# Patient Record
Sex: Male | Born: 1970 | Race: Black or African American | Hispanic: No | Smoking: Former smoker
Health system: Southern US, Community
[De-identification: ages and names within clinical notes are randomized; demographics above are authoritative.]

## PROBLEM LIST (undated history)

## (undated) DIAGNOSIS — F329 Major depressive disorder, single episode, unspecified: Secondary | ICD-10-CM

## (undated) DIAGNOSIS — I10 Essential (primary) hypertension: Secondary | ICD-10-CM

## (undated) DIAGNOSIS — F32A Depression, unspecified: Secondary | ICD-10-CM

## (undated) HISTORY — DX: Major depressive disorder, single episode, unspecified: F32.9

## (undated) HISTORY — DX: Essential (primary) hypertension: I10

## (undated) HISTORY — DX: Depression, unspecified: F32.A

---

## 2006-05-10 ENCOUNTER — Emergency Department: Payer: Self-pay | Admitting: Emergency Medicine

## 2009-01-30 ENCOUNTER — Emergency Department: Payer: Self-pay | Admitting: Emergency Medicine

## 2010-01-16 ENCOUNTER — Emergency Department: Payer: Self-pay | Admitting: Emergency Medicine

## 2011-07-24 ENCOUNTER — Emergency Department: Payer: Self-pay | Admitting: Emergency Medicine

## 2011-07-31 ENCOUNTER — Ambulatory Visit: Payer: Self-pay | Admitting: Internal Medicine

## 2011-11-19 HISTORY — PX: UPPER GI ENDOSCOPY: SHX6162

## 2012-03-15 IMAGING — CR DG CHEST 2V
1 series · 2 of 2 positions shown · non-contrast
Comparison: none

REASON FOR EXAM: SOB; pt in [place]
COMMENTS:

PROCEDURE:     DXR - DXR CHEST PA (OR AP) AND LATERAL  - July 25, 2011 [DATE]
RESULT:     Comparison: None

[Series 1: view not recorded · 0.17mm/px · 2 of 2 slices shown]
[im 1/2]
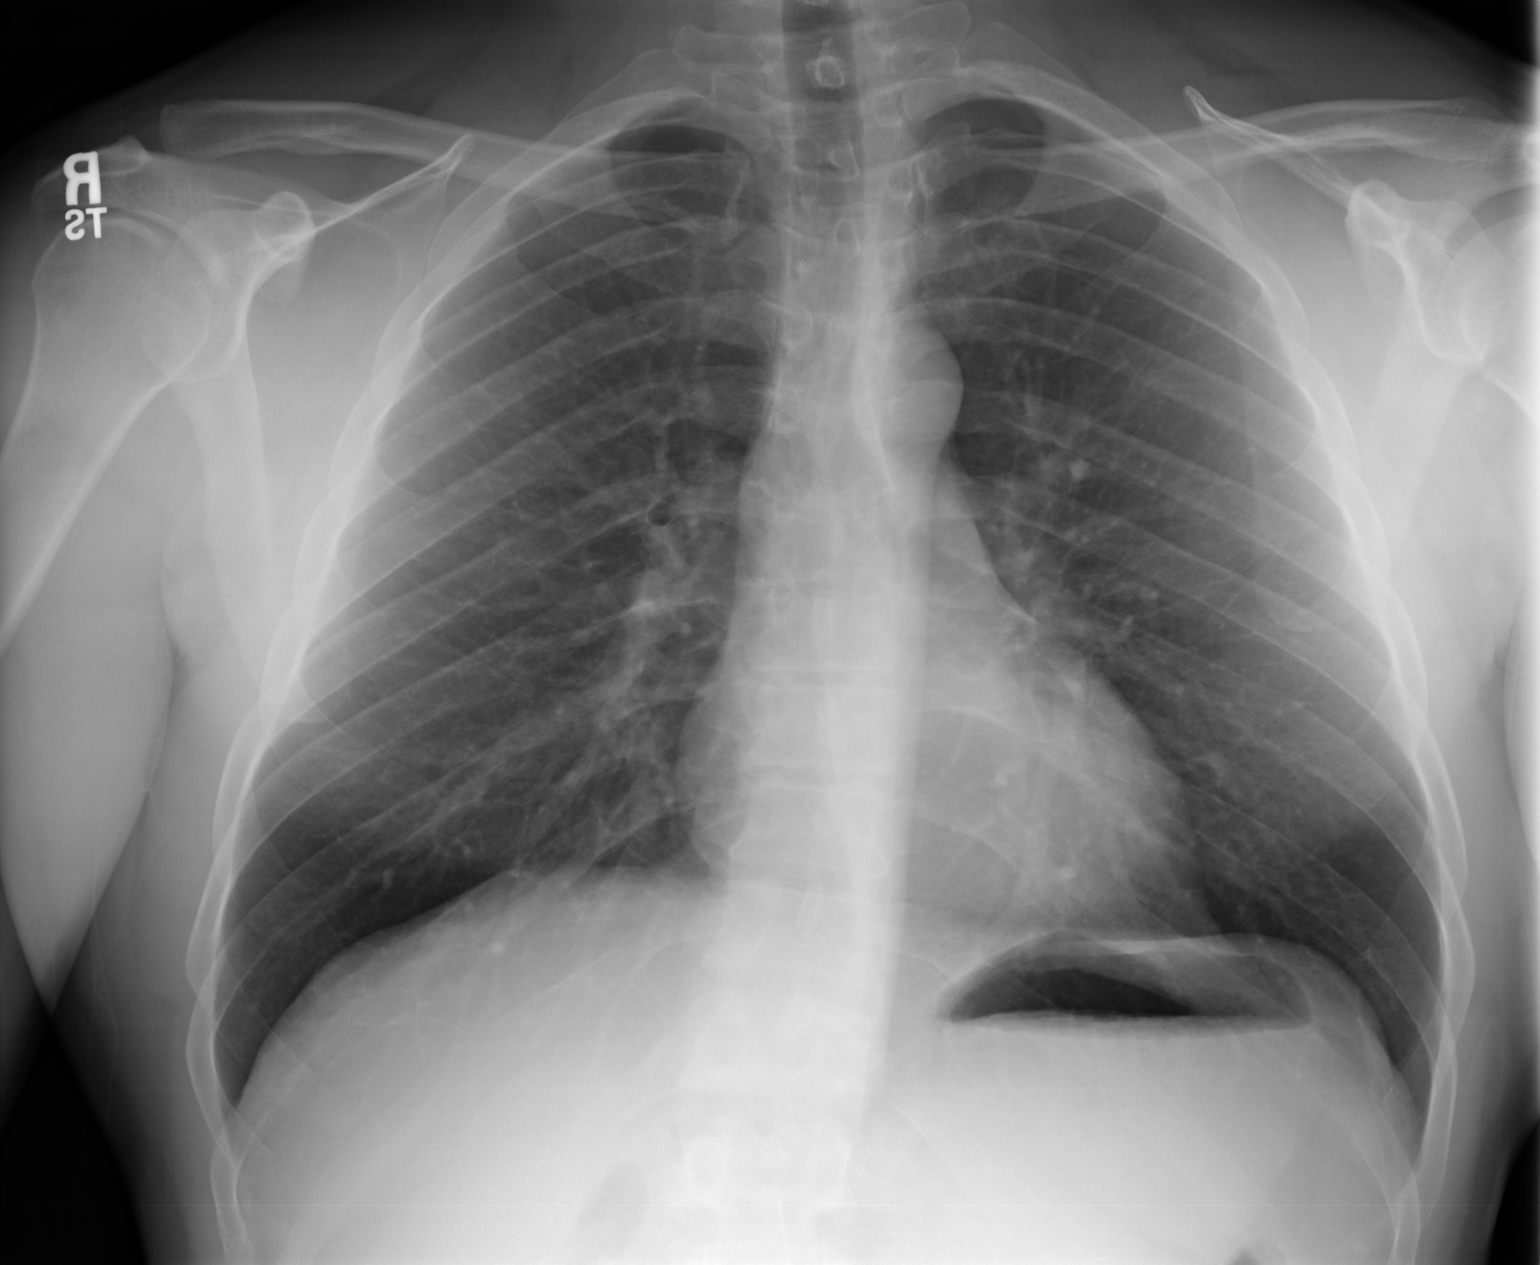
[im 2/2]
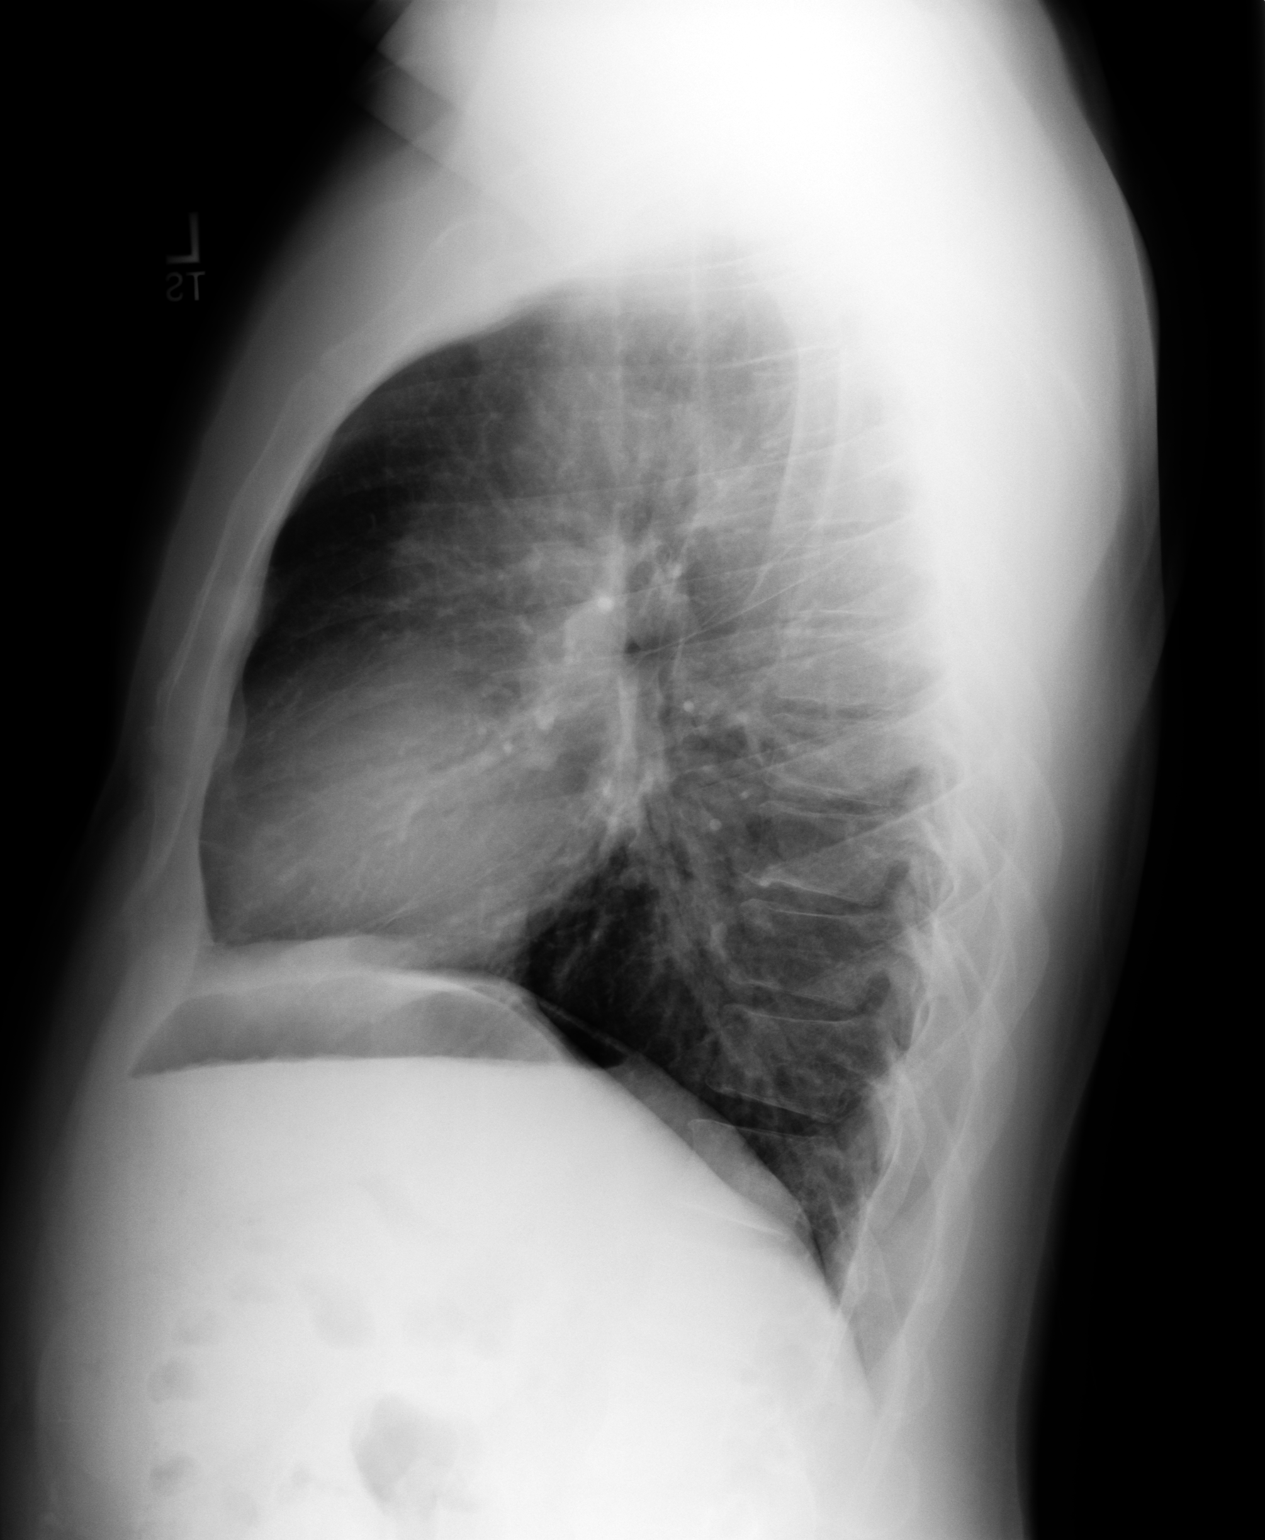

[2 of 2 positions shown; findings below may reference images not displayed]

FINDINGS: PA and lateral chest radiographs are provided.  There is no focal
parenchymal opacity, pleural effusion, or pneumothorax. The heart and
mediastinum are unremarkable.  The osseous structures are unremarkable.
IMPRESSION: No acute disease of the chest.

## 2012-04-08 ENCOUNTER — Ambulatory Visit: Payer: Self-pay | Admitting: Gastroenterology

## 2015-07-21 ENCOUNTER — Other Ambulatory Visit: Payer: Self-pay | Admitting: Family Medicine

## 2015-07-31 ENCOUNTER — Encounter: Payer: Self-pay | Admitting: Family Medicine

## 2015-07-31 ENCOUNTER — Ambulatory Visit (INDEPENDENT_AMBULATORY_CARE_PROVIDER_SITE_OTHER): Payer: No Typology Code available for payment source | Admitting: Family Medicine

## 2015-07-31 VITALS — BP 122/78 | HR 65 | Temp 98.6°F | Resp 16 | Ht 67.0 in | Wt 175.5 lb

## 2015-07-31 DIAGNOSIS — Z23 Encounter for immunization: Secondary | ICD-10-CM

## 2015-07-31 DIAGNOSIS — I10 Essential (primary) hypertension: Secondary | ICD-10-CM | POA: Diagnosis not present

## 2015-07-31 DIAGNOSIS — I517 Cardiomegaly: Secondary | ICD-10-CM

## 2015-07-31 NOTE — Patient Instructions (Addendum)
Need referral to cardiologist abnormal EKG and echocardiogram

## 2015-07-31 NOTE — Progress Notes (Signed)
Name: Bryan Green   MRN: [id number]    DOB: 1971/08/13   Date:07/31/2015       Progress Note  Subjective  Chief Complaint  Chief Complaint  Patient presents with  . Hypertension    pt here for 4 month follow up    HPI  Hypertension   Patient presents for follow-up of hypertension. It has been present for over 2 years.  Patient states that there is compliance with medical regimen which consists of chlorthalidone 25 mg daily Percocet take it consistently . There is no end organ disease. Cardiac risk factors include hypertension hyperlipidemia and diabetes.  Exercise regimen consist of aerobic as well as weight training .  Diet consist of low-sodium low-fat .  Hyperlipidemia  Patient has a history of hyperlipidemia for 3 years.  Current medical regimen consist of diet and exercise .  Compliance is good .  Diet and exercise are currently followed noted above .  Risk factors for cardiovascular disease include hyperlipidemia hypertension .   There have been no side effects from the medication.    Reflux esophagitis  Patient's reflux symptoms are not improved. He is currently on Protonix 40 mg most days and has had better hygiene with his diet.  Past Medical History  Diagnosis Date  . Hypertension   . Depression     Social History  Substance Use Topics  . Smoking status: Former Research scientist (life sciences)  . Smokeless tobacco: Not on file  . Alcohol Use: No     Current outpatient prescriptions:  .  chlordiazePOXIDE (LIBRIUM) 25 MG capsule, Take 25 mg by mouth 3 (three) times daily as needed for anxiety., Disp: , Rfl:  .  pantoprazole (PROTONIX) 40 MG tablet, TAKE 1 TABLET EVERY DAY, Disp: 30 tablet, Rfl: 5  No Known Allergies  Review of Systems  Constitutional: Negative for fever, chills and weight loss.  HENT: Negative for congestion, hearing loss, sore throat and tinnitus.   Eyes: Negative for blurred vision, double vision and redness.  Respiratory: Negative for cough, hemoptysis and  shortness of breath.   Cardiovascular: Negative for chest pain, palpitations, orthopnea, claudication and leg swelling.  Gastrointestinal: Positive for heartburn. Negative for nausea, vomiting, diarrhea, constipation and blood in stool.  Genitourinary: Negative for dysuria, urgency, frequency and hematuria.  Musculoskeletal: Negative for myalgias, back pain, joint pain, falls and neck pain.  Skin: Negative for itching.  Neurological: Negative for dizziness, tingling, tremors, focal weakness, seizures, loss of consciousness, weakness and headaches.  Endo/Heme/Allergies: Does not bruise/bleed easily.  Psychiatric/Behavioral: Negative for depression and substance abuse. The patient is not nervous/anxious and does not have insomnia.      Objective  Filed Vitals:   07/31/15 0939  BP: 122/78  Pulse: 65  Temp: 98.6 F (37 C)  Resp: 16  Height: 5\' 7"  (1.702 m)  Weight: 175 lb 8 oz (79.606 kg)  SpO2: 97%     Physical Exam  Constitutional: He is oriented to person, place, and time and well-developed, well-nourished, and in no distress.  HENT:  Head: Normocephalic.  Eyes: EOM are normal. Pupils are equal, round, and reactive to light.  Neck: Normal range of motion. Neck supple. No thyromegaly present.  Cardiovascular: Normal rate, regular rhythm and normal heart sounds.   No murmur heard. Pulmonary/Chest: Effort normal and breath sounds normal. No respiratory distress. He has no wheezes.  Abdominal: Soft. Bowel sounds are normal.  Musculoskeletal: Normal range of motion. He exhibits no edema.  Lymphadenopathy:    He has no  cervical adenopathy.  Neurological: He is alert and oriented to person, place, and time. No cranial nerve deficit. Gait normal. Coordination normal.  Skin: Skin is warm and dry. No rash noted.  Psychiatric: Affect and judgment normal.     1. Essential hypertension Well-controlled  2. LVH (left ventricular hypertrophy) The further evaluation of her  cardiologist's  Differential diagnosis include  hypertrophy from long-standing hypertension versus cardiomyopathy this has not been worked up. - EKG 12-Lead left ventricular - Echocardiogram; Future  3. Need for influenza vaccination Given today - Flu Vaccine QUAD 36+ mos PF IM (Fluarix & Fluzone Quad PF)

## 2015-08-01 ENCOUNTER — Encounter: Payer: Self-pay | Admitting: Family Medicine

## 2015-08-01 DIAGNOSIS — Z23 Encounter for immunization: Secondary | ICD-10-CM | POA: Diagnosis not present

## 2016-11-08 ENCOUNTER — Emergency Department
Admission: EM | Admit: 2016-11-08 | Discharge: 2016-11-08 | Disposition: A | Discharge status: Home / Self Care | Payer: Self-pay | Attending: Emergency Medicine | Admitting: Emergency Medicine

## 2016-11-08 ENCOUNTER — Encounter: Payer: Self-pay | Admitting: Emergency Medicine

## 2016-11-08 DIAGNOSIS — R6889 Other general symptoms and signs: Secondary | ICD-10-CM

## 2016-11-08 DIAGNOSIS — B349 Viral infection, unspecified: Secondary | ICD-10-CM | POA: Insufficient documentation

## 2016-11-08 DIAGNOSIS — Z79899 Other long term (current) drug therapy: Secondary | ICD-10-CM | POA: Insufficient documentation

## 2016-11-08 DIAGNOSIS — Z87891 Personal history of nicotine dependence: Secondary | ICD-10-CM | POA: Insufficient documentation

## 2016-11-08 DIAGNOSIS — I10 Essential (primary) hypertension: Secondary | ICD-10-CM | POA: Insufficient documentation

## 2016-11-08 LAB — POCT RAPID STREP A: Streptococcus, Group A Screen (Direct): NEGATIVE

## 2016-11-08 MED ORDER — KETOROLAC TROMETHAMINE 60 MG/2ML IM SOLN
60.0000 mg | Freq: Once | INTRAMUSCULAR | Status: AC
  Administered 2016-11-08: 60 mg via INTRAMUSCULAR
  Filled 2016-11-08: qty 2

## 2016-11-08 MED ORDER — ONDANSETRON HCL 4 MG PO TABS: 4.0000 mg | ORAL_TABLET | Freq: Three times a day (TID) | ORAL | 0 refills | Status: DC | PRN

## 2016-11-08 MED ORDER — IBUPROFEN 600 MG PO TABS: 600.0000 mg | ORAL_TABLET | Freq: Three times a day (TID) | ORAL | 0 refills | Status: DC | PRN

## 2016-11-08 MED ORDER — LIDOCAINE VISCOUS 2 % MT SOLN: 5.0000 mL | Freq: Four times a day (QID) | OROMUCOSAL | 0 refills | Status: DC | PRN

## 2016-11-08 MED ORDER — PSEUDOEPH-BROMPHEN-DM 30-2-10 MG/5ML PO SYRP: 5.0000 mL | ORAL_SOLUTION | Freq: Four times a day (QID) | ORAL | 0 refills | Status: DC | PRN

## 2016-11-08 NOTE — ED Notes (Signed)
Patient c/o sore throat for the past few days, patient also c/o left ear pain, nasal congestion, and runny nose. Patient states that he has also had nausea and diarrhea, and weakness.  Patient thinks he had flu shot but is not completely sure.

## 2016-11-08 NOTE — ED Triage Notes (Signed)
Patient arrives to ED via POV with c/o sore throat x4 days and generalized weakness. A&O x4.

## 2016-11-08 NOTE — ED Provider Notes (Signed)
[facility] Emergency Department Provider Note   ____________________________________________   First MD Initiated Contact with Patient 11/08/16 1417     (approximate)  I have reviewed the triage vital signs and the nursing notes.   HISTORY  Chief Complaint Sore Throat    HPI Bryan Green is a 45 y.o. male patient complaining of sore throat and generalized malaise for 4 days. Patient stated intermittent frontal headache, nausea without vomiting, and diarrhea. Patient denies fever and chills associated this complaint. Patient stated no relief over-the-counter medications. Patient states she believes she's taken a flu shot this season was unsure. No other palliative measures for this complaint.Patient rates his pain discomfort as 7/10. Patient describes pain as "achy".   Past Medical History:  Diagnosis Date  . Depression   . Hypertension     There are no active problems to display for this patient.   History reviewed. No pertinent surgical history.  Prior to Admission medications   Medication Sig Start Date End Date Taking? Authorizing Provider  brompheniramine-pseudoephedrine-DM 30-2-10 MG/5ML syrup Take 5 mLs by mouth 4 (four) times daily as needed. Mixed with viscous 5 mL of viscous lidocaine for swish and swallow. 11/08/16   Sable Feil, PA-C  chlordiazePOXIDE (LIBRIUM) 25 MG capsule Take 25 mg by mouth 3 (three) times daily as needed for anxiety.    Historical Provider, MD  ibuprofen (ADVIL,MOTRIN) 600 MG tablet Take 1 tablet (600 mg total) by mouth every 8 (eight) hours as needed. 11/08/16   Sable Feil, PA-C  lidocaine (XYLOCAINE) 2 % solution Use as directed 5 mLs in the mouth or throat every 6 (six) hours as needed for mouth pain. Mixed with 5 mL of Bromfed-DM for swish and swallow. 11/08/16   Sable Feil, PA-C  ondansetron (ZOFRAN) 4 MG tablet Take 1 tablet (4 mg total) by mouth every 8 (eight) hours as needed for nausea or  vomiting. 11/08/16 11/08/17  Sable Feil, PA-C  pantoprazole (PROTONIX) 40 MG tablet TAKE 1 TABLET EVERY DAY 07/21/15   Ashok Norris, MD    Allergies Patient has no known allergies.  Family History  Problem Relation Age of Onset  . Diabetes Mother   . Cancer Father   . Kidney disease Father     Social History Social History  Substance Use Topics  . Smoking status: Former Research scientist (life sciences)  . Smokeless tobacco: Not on file  . Alcohol use No    Review of Systems Constitutional: No fever/chills Eyes: No visual changes. ENT: No sore throat. Cardiovascular: Denies chest pain. Respiratory: Denies shortness of breath. Gastrointestinal: No abdominal pain.  No nausea, no vomiting.  No diarrhea.  No constipation. Genitourinary: Negative for dysuria. Musculoskeletal: Negative for back pain. Skin: Negative for rash. Neurological: Negative for headaches, focal weakness or numbness. Psychiatric:Depression Endocrine:Hypertension  ____________________________________________   PHYSICAL EXAM:  VITAL SIGNS: ED Triage Vitals  Enc Vitals Group     BP 11/08/16 1359 (!) 150/82     Pulse Rate 11/08/16 1359 66     Resp 11/08/16 1359 17     Temp 11/08/16 1359 98.3 F (36.8 C)     Temp Source 11/08/16 1359 Oral     SpO2 11/08/16 1359 99 %     Weight 11/08/16 1359 180 lb (81.6 kg)     Height 11/08/16 1359 5\' 7"  (1.702 m)     Head Circumference --      Peak Flow --      Pain Score 11/08/16 1400  7     Pain Loc --      Pain Edu? --      Excl. in [place]? --     Constitutional: Alert and oriented. Well appearing and in no acute distress. Eyes: Conjunctivae are normal. PERRL. EOMI. Head: Atraumatic. Nose: Edematous nasal turbinates with clear rhinorrhea Mouth/Throat: Mucous membranes are moist.  Oropharynx erythematous. No exudate Neck: No stridor.  No cervical spine tenderness to palpation. Hematological/Lymphatic/Immunilogical: No cervical lymphadenopathy. Cardiovascular: Normal rate,  regular rhythm. Grossly normal heart sounds.  Good peripheral circulation. Elevated blood pressure Respiratory: Normal respiratory effort.  No retractions. Lungs CTAB. Gastrointestinal: Soft and nontender. No distention. No abdominal bruits. No CVA tenderness. Musculoskeletal: No lower extremity tenderness nor edema.  No joint effusions. Neurologic:  Normal speech and language. No gross focal neurologic deficits are appreciated. No gait instability. Skin:  Skin is warm, dry and intact. No rash noted. Psychiatric: Mood and affect are normal. Speech and behavior are normal.  ____________________________________________   LABS (all labs ordered are listed, but only abnormal results are displayed)  Labs Reviewed  POCT RAPID STREP A   ___Negative rapid strep test _________________________________________  EKG   ____________________________________________  RADIOLOGY   ____________________________________________   PROCEDURES  Procedure(s) performed: None  Procedures  Critical Care performed: No  ____________________________________________   INITIAL IMPRESSION / ASSESSMENT AND PLAN / ED COURSE  Pertinent labs & imaging results that were available during my care of the patient were reviewed by me and considered in my medical decision making (see chart for details).  Viral illness. Patient given discharge Instructions. Patient Given a Prescription for Bromfed-DM Viscous Lidocaine, and naproxen. Patient given a work note.  Clinical Course      ____________________________________________   FINAL CLINICAL IMPRESSION(S) / ED DIAGNOSES  Final diagnoses:  Flu-like symptoms      NEW MEDICATIONS STARTED DURING THIS VISIT:  New Prescriptions   BROMPHENIRAMINE-PSEUDOEPHEDRINE-DM 30-2-10 MG/5ML SYRUP    Take 5 mLs by mouth 4 (four) times daily as needed. Mixed with viscous 5 mL of viscous lidocaine for swish and swallow.   IBUPROFEN (ADVIL,MOTRIN) 600 MG TABLET     Take 1 tablet (600 mg total) by mouth every 8 (eight) hours as needed.   LIDOCAINE (XYLOCAINE) 2 % SOLUTION    Use as directed 5 mLs in the mouth or throat every 6 (six) hours as needed for mouth pain. Mixed with 5 mL of Bromfed-DM for swish and swallow.   ONDANSETRON (ZOFRAN) 4 MG TABLET    Take 1 tablet (4 mg total) by mouth every 8 (eight) hours as needed for nausea or vomiting.     Note:  This document was prepared using Dragon voice recognition software and may include unintentional dictation errors.    Sable Feil, PA-C 11/08/16 1438    Lisa Roca, MD 11/08/16 [phone]

## 2016-11-08 NOTE — ED Notes (Signed)
Pt discharged home after verbalizing understanding of discharge instructions; nad noted. 

## 2016-11-10 ENCOUNTER — Encounter: Payer: Self-pay | Admitting: Emergency Medicine

## 2016-11-10 ENCOUNTER — Emergency Department
Admission: EM | Admit: 2016-11-10 | Discharge: 2016-11-10 | Disposition: A | Discharge status: Home / Self Care | Payer: No Typology Code available for payment source | Attending: Emergency Medicine | Admitting: Emergency Medicine

## 2016-11-10 DIAGNOSIS — I10 Essential (primary) hypertension: Secondary | ICD-10-CM | POA: Insufficient documentation

## 2016-11-10 DIAGNOSIS — Z791 Long term (current) use of non-steroidal anti-inflammatories (NSAID): Secondary | ICD-10-CM | POA: Insufficient documentation

## 2016-11-10 DIAGNOSIS — J029 Acute pharyngitis, unspecified: Secondary | ICD-10-CM | POA: Insufficient documentation

## 2016-11-10 DIAGNOSIS — Z87891 Personal history of nicotine dependence: Secondary | ICD-10-CM | POA: Insufficient documentation

## 2016-11-10 DIAGNOSIS — Z79899 Other long term (current) drug therapy: Secondary | ICD-10-CM | POA: Insufficient documentation

## 2016-11-10 MED ORDER — DEXAMETHASONE 4 MG PO TABS: ORAL_TABLET | ORAL | 0 refills | Status: DC

## 2016-11-10 MED ORDER — AMOXICILLIN 875 MG PO TABS: 875.0000 mg | ORAL_TABLET | Freq: Two times a day (BID) | ORAL | 0 refills | Status: DC

## 2016-11-10 NOTE — ED Provider Notes (Signed)
[facility] Emergency Department Provider Note  ____________________________________________  Time seen: Approximately 10:41 AM  I have reviewed the triage vital signs and the nursing notes.   HISTORY  Chief Complaint Sore Throat  Sore throat  HPI Bryan Green is a 45 y.o. male , NAD, presents to the emergency department with a several-day history of sore throat. Was seen in this emergency department 2 days ago for the same. Had negative rapid strep test and was prescribed medications for supportive care. States he has been taking his medications but notes the sore throat is worsening and feels more swollen. Also notes onset of left ear pressure. Denies runny nose, nasal congestion, sinus pressure, ear drainage. Has been able to eat, drink and swallow without difficulty. Has had no difficulty breathing. Denies any fevers, chills, body aches, headaches, abdominal pain, nausea, vomiting, rashes. Has not had swelling about the lips/tongue/throat. Denies any other sick contacts. Last dose of Tylenol and Naprosyn was last night.   Past Medical History:  Diagnosis Date  . Depression   . Hypertension     There are no active problems to display for this patient.   History reviewed. No pertinent surgical history.  Prior to Admission medications   Medication Sig Start Date End Date Taking? Authorizing Provider  amoxicillin (AMOXIL) 875 MG tablet Take 1 tablet (875 mg total) by mouth 2 (two) times daily. 11/10/16   Deshayla Empson L Imojean Yoshino, PA-C  brompheniramine-pseudoephedrine-DM 30-2-10 MG/5ML syrup Take 5 mLs by mouth 4 (four) times daily as needed. Mixed with viscous 5 mL of viscous lidocaine for swish and swallow. 11/08/16   Sable Feil, PA-C  chlordiazePOXIDE (LIBRIUM) 25 MG capsule Take 25 mg by mouth 3 (three) times daily as needed for anxiety.    Historical Provider, MD  dexamethasone (DECADRON) 4 MG tablet Take 6 tablets on Day 1 with food, then decrease by 1  tablet daily until finished (6,5,4,3,2,1) 11/10/16   Kenson Groh L Gwin Eagon, PA-C  ibuprofen (ADVIL,MOTRIN) 600 MG tablet Take 1 tablet (600 mg total) by mouth every 8 (eight) hours as needed. 11/08/16   Sable Feil, PA-C  lidocaine (XYLOCAINE) 2 % solution Use as directed 5 mLs in the mouth or throat every 6 (six) hours as needed for mouth pain. Mixed with 5 mL of Bromfed-DM for swish and swallow. 11/08/16   Sable Feil, PA-C  ondansetron (ZOFRAN) 4 MG tablet Take 1 tablet (4 mg total) by mouth every 8 (eight) hours as needed for nausea or vomiting. 11/08/16 11/08/17  Sable Feil, PA-C  pantoprazole (PROTONIX) 40 MG tablet TAKE 1 TABLET EVERY DAY 07/21/15   Ashok Norris, MD    Allergies Patient has no known allergies.  Family History  Problem Relation Age of Onset  . Diabetes Mother   . Cancer Father   . Kidney disease Father     Social History Social History  Substance Use Topics  . Smoking status: Former Research scientist (life sciences)  . Smokeless tobacco: Never Used  . Alcohol use No     Review of Systems  Constitutional: No fever/chills ENT: Positive sore throat, left ear pain. No nasal congestion, runny nose, sinus pressure. No swelling about the lips/tongue/throat. Cardiovascular: No chest pain. Respiratory: No cough, chest congestion. No shortness of breath. No wheezing.  Gastrointestinal: No abdominal pain.  No nausea, vomiting.   Musculoskeletal: Negative for general myalgias.  Skin: Negative for rash. Neurological: Negative for headaches. 10-point ROS otherwise negative.  ____________________________________________   PHYSICAL EXAM:  VITAL SIGNS:  ED Triage Vitals  Enc Vitals Group     BP      Pulse      Resp      Temp      Temp src      SpO2      Weight      Height      Head Circumference      Peak Flow      Pain Score      Pain Loc      Pain Edu?      Excl. in [place]?      Constitutional: Alert and oriented. Well appearing and in no acute distress. Eyes: Conjunctivae  are normal Without icterus, injection or discharge. Head: Atraumatic. ENT:      Ears: Bilateral TMs visualized without effusion, bulging, erythema or perforation.      Nose: No congestion/rhinnorhea. No epistaxis.      Mouth/Throat: Mucous membranes are moist. Pharynx with mild left-sided injection and swelling but no exudate. Uvula is midline. Airway is patent. Neck: No stridor. No carotid bruits. Supple with full range of motion. Hematological/Lymphatic/Immunilogical: Positive left, anterior, focal cervical lymphadenopathy with mild tenderness to palpation but is mobile. Cardiovascular: Normal rate, regular rhythm. Normal S1 and S2.  Good peripheral circulation. Respiratory: Normal respiratory effort without tachypnea or retractions. Lungs CTAB with breath sounds noted in all lung fields. No wheeze, rhonchi, rales Neurologic:  Normal speech and language. No gross focal neurologic deficits are appreciated.  Skin:  Skin is warm, dry and intact. No rash noted. Psychiatric: Mood and affect are normal. Speech and behavior are normal. Patient exhibits appropriate insight and judgement.   ____________________________________________   LABS  None ____________________________________________  EKG  None ____________________________________________  RADIOLOGY  None ____________________________________________    PROCEDURES  Procedure(s) performed: None   Procedures   Medications - No data to display   ____________________________________________   INITIAL IMPRESSION / ASSESSMENT AND PLAN / ED COURSE  Pertinent labs & imaging results that were available during my care of the patient were reviewed by me and considered in my medical decision making (see chart for details).  Clinical Course     Patient's diagnosis is consistent with Acute pharyngitis. Patient will be discharged home with prescriptions for amoxicillin and Decadron Dosepak to take stretch. Patient is to  discontinue use of Naprosyn or ibuprofen but may continue Tylenol. May also continue use of lidocaine viscous as needed for throat pain.. Patient is to follow up with Dr. Tami Ribas in ENT if symptoms persist past this treatment course. Patient is given ED precautions to return to the ED for any worsening or new symptoms.    ____________________________________________  FINAL CLINICAL IMPRESSION(S) / ED DIAGNOSES  Final diagnoses:  Pharyngitis, unspecified etiology      NEW MEDICATIONS STARTED DURING THIS VISIT:  New Prescriptions   AMOXICILLIN (AMOXIL) 875 MG TABLET    Take 1 tablet (875 mg total) by mouth 2 (two) times daily.   DEXAMETHASONE (DECADRON) 4 MG TABLET    Take 6 tablets on Day 1 with food, then decrease by 1 tablet daily until finished (6,5,4,3,2,1)         Braxton Feathers, PA-C 11/10/16 1108    Carrie Mew, MD 11/14/16 [phone]

## 2016-11-10 NOTE — ED Triage Notes (Signed)
Pt arrived to ED with c/o of sore throat. Pt seen in ED 2 days ago and dx with Strep throat. Pt states pain and swelling has increased.

## 2016-11-10 NOTE — ED Notes (Signed)
Pt verbalized understanding of discharge instructions. NAD at this time. 

## 2017-07-30 ENCOUNTER — Encounter: Payer: No Typology Code available for payment source | Admitting: Family Medicine

## 2017-08-01 ENCOUNTER — Encounter: Payer: No Typology Code available for payment source | Admitting: Family Medicine

## 2017-08-25 ENCOUNTER — Ambulatory Visit (INDEPENDENT_AMBULATORY_CARE_PROVIDER_SITE_OTHER): Payer: BLUE CROSS/BLUE SHIELD | Admitting: Family Medicine

## 2017-08-25 ENCOUNTER — Encounter: Payer: Self-pay | Admitting: Family Medicine

## 2017-08-25 DIAGNOSIS — I10 Essential (primary) hypertension: Secondary | ICD-10-CM | POA: Diagnosis not present

## 2017-08-25 MED ORDER — LISINOPRIL-HYDROCHLOROTHIAZIDE 10-12.5 MG PO TABS: 1.0000 | ORAL_TABLET | Freq: Every day | ORAL | 0 refills | Status: DC

## 2017-08-25 NOTE — Progress Notes (Signed)
Name: Bryan Green   MRN: [id number]    DOB: 1971-01-19   Date:08/25/2017       Progress Note  Subjective  Chief Complaint  Chief Complaint  Patient presents with  . Annual Exam  This patient is new to me, formerly seen by Dr. Rutherford Green  HPI  Pt. 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  Patient has been working on lowering his blood pressure, he has reduced his salt consumption, fried foods, he is physically active.  In the past, he has not taken any medication for blood pressure.    Past Medical History:  Diagnosis Date  . Depression   . Hypertension     No past surgical history on file.  Family History  Problem Relation Age of Onset  . Diabetes Mother   . Cancer Father   . Kidney disease Father     Social History   Social History  . Marital status: Single    Spouse name: N/A  . Number of children: N/A  . Years of education: N/A   Occupational History  . Not on file.   Social History Main Topics  . Smoking status: Former Research scientist (life sciences)  . Smokeless tobacco: Never Used  . Alcohol use No  . Drug use: No  . Sexual activity: Not on file   Other Topics Concern  . Not on file   Social History Narrative  . No narrative on file     Current Outpatient Prescriptions:  .  amoxicillin (AMOXIL) 875 MG tablet, Take 1 tablet (875 mg total) by mouth 2 (two) times daily. (Patient not taking: Reported on 08/25/2017), Disp: 20 tablet, Rfl: 0 .  brompheniramine-pseudoephedrine-DM 30-2-10 MG/5ML syrup, Take 5 mLs by mouth 4 (four) times daily as needed. Mixed with viscous 5 mL of viscous lidocaine for swish and swallow. (Patient not taking: Reported on 08/25/2017), Disp: 120 mL, Rfl: 0 .  chlordiazePOXIDE (LIBRIUM) 25 MG capsule, Take 25 mg by mouth 3 (three) times daily as  needed for anxiety., Disp: , Rfl:  .  dexamethasone (DECADRON) 4 MG tablet, Take 6 tablets on Day 1 with food, then decrease by 1 tablet daily until finished (6,5,4,3,2,1) (Patient not taking: Reported on 08/25/2017), Disp: 21 tablet, Rfl: 0 .  ibuprofen (ADVIL,MOTRIN) 600 MG tablet, Take 1 tablet (600 mg total) by mouth every 8 (eight) hours as needed. (Patient not taking: Reported on 08/25/2017), Disp: 15 tablet, Rfl: 0 .  lidocaine (XYLOCAINE) 2 % solution, Use as directed 5 mLs in the mouth or throat every 6 (six) hours as needed for mouth pain. Mixed with 5 mL of Bromfed-DM for swish and swallow. (Patient not taking: Reported on 08/25/2017), Disp: 100 mL, Rfl: 0 .  ondansetron (ZOFRAN) 4 MG tablet, Take 1 tablet (4 mg total) by mouth every 8 (eight) hours as needed for nausea or vomiting. (Patient not taking: Reported on 08/25/2017), Disp: 12 tablet, Rfl: 0 .  pantoprazole (PROTONIX) 40 MG tablet, TAKE 1 TABLET EVERY DAY (Patient not taking: Reported on 08/25/2017), Disp: 30 tablet, Rfl: 5  No Known Allergies   ROS  Please see history of present illness for complete discussion of ROS  Objective  Vitals:   08/25/17 1310  BP: (!) 158/84  Pulse: 64  Resp: 16  Temp: 98.3  F (36.8 C)  TempSrc: Oral  SpO2: 99%  Weight: 180 lb 12.8 oz (82 kg)  Height: 5\' 7"  (1.702 m)    Physical Exam  Constitutional: He is oriented to person, place, and time and well-developed, well-nourished, and in no distress.  HENT:  Head: Normocephalic and atraumatic.  Eyes: Pupils are equal, round, and reactive to light.  Cardiovascular: Normal rate, regular rhythm and normal heart sounds.   No murmur heard. Pulmonary/Chest: Effort normal and breath sounds normal. He has no wheezes.  Abdominal: Soft. Bowel sounds are normal. There is no tenderness.  Musculoskeletal: He exhibits no edema.  Neurological: He is alert and oriented to person, place, and time.  Psychiatric: Mood, memory, affect and judgment normal.   Nursing note and vitals reviewed.      Assessment & Plan  1. Essential hypertension Elevated blood pressure and worse on manual repeat, start on lisinopril-HCTZ, check liver, kidney and intellect), reassess in 4 weeks - lisinopril-hydrochlorothiazide (PRINZIDE,ZESTORETIC) 10-12.5 MG tablet; Take 1 tablet by mouth daily.  Dispense: 90 tablet; Refill: 0 - COMPLETE METABOLIC PANEL WITH GFR   Bryan Green [facility] 08/25/2017 1:36 PM

## 2017-08-26 ENCOUNTER — Telehealth: Payer: Self-pay | Admitting: Family Medicine

## 2017-08-26 DIAGNOSIS — I10 Essential (primary) hypertension: Secondary | ICD-10-CM

## 2017-08-26 NOTE — Telephone Encounter (Signed)
Pt was seen on yesterday and was prescribed a new blood pressure medication (lisinopril0. 2 hours after taking the medication this morning the patient has been a little dizzy and gittery and has been having a headache all day. Pt think it maybe to strong. He is asking that you please prescribe the medication that he was on before (he does not remember the name of the medication. Please send to [facility]. Please give pt a call once this has been taken care of

## 2017-08-26 NOTE — Telephone Encounter (Signed)
Please call patient

## 2017-08-27 MED ORDER — CHLORTHALIDONE 25 MG PO TABS: 25.0000 mg | ORAL_TABLET | Freq: Every day | ORAL | 0 refills | Status: DC

## 2017-08-27 NOTE — Telephone Encounter (Signed)
Patient was prescribed lisinopril/HCTZ 10-12.5 mg for elevated blood pressure. He reports 82 took it he felt dizzy and had a headache all day yesterday. 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 He reports that he was prescribed a blood pressure medicine by Dr. Derrek Monaco which seems to work and had no side effects. Upon review of notes and Dr. Mechele Collin, it appears that he was on no blood pressure medicine but Dr. Salem Senate history of present illness from 07/31/2015 and noted that he was on chlorthalidone. Will give a trial of chlorthalidone for patient's blood pressure, advised to return tomorrow to  obtain lab work, continue checking blood pressure regularly and follow-up in 6 weeks

## 2017-08-28 ENCOUNTER — Ambulatory Visit: Payer: BLUE CROSS/BLUE SHIELD | Admitting: Emergency Medicine

## 2017-08-28 VITALS — BP 138/96

## 2017-08-28 DIAGNOSIS — I10 Essential (primary) hypertension: Secondary | ICD-10-CM

## 2017-08-28 LAB — COMPLETE METABOLIC PANEL WITH GFR
AG RATIO: 1.7 (calc) (ref 1.0–2.5)
ALT: 18 U/L (ref 9–46)
AST: 18 U/L (ref 10–40)
Albumin: 4.5 g/dL (ref 3.6–5.1)
Alkaline phosphatase (APISO): 64 U/L (ref 40–115)
BILIRUBIN TOTAL: 0.3 mg/dL (ref 0.2–1.2)
BUN: 12 mg/dL (ref 7–25)
CHLORIDE: 101 mmol/L (ref 98–110)
CO2: 29 mmol/L (ref 20–32)
Calcium: 10 mg/dL (ref 8.6–10.3)
Creat: 1.1 mg/dL (ref 0.60–1.35)
GFR, EST AFRICAN AMERICAN: 92 mL/min/{1.73_m2} (ref >=60)
GFR, EST NON AFRICAN AMERICAN: 80 mL/min/{1.73_m2} (ref >=60)
GLOBULIN: 2.6 g/dL (ref 1.9–3.7)
Glucose, Bld: 88 mg/dL (ref 65–139)
POTASSIUM: 4.5 mmol/L (ref 3.5–5.3)
SODIUM: 140 mmol/L (ref 135–146)
TOTAL PROTEIN: 7.1 g/dL (ref 6.1–8.1)

## 2017-08-29 ENCOUNTER — Telehealth: Payer: Self-pay

## 2017-08-29 NOTE — Telephone Encounter (Signed)
Patient came for B/P check.

## 2017-08-29 NOTE — Telephone Encounter (Signed)
-----   Message from Roselee Nova, MD sent at 08/29/2017  9:51 AM EDT ----- CMP shows normal glucose, electrolytes, kidney function and liver enzymes

## 2017-08-29 NOTE — Telephone Encounter (Signed)
Called pt, no answer. LM for pt informing him of information below.

## 2017-09-22 ENCOUNTER — Encounter: Payer: Self-pay | Admitting: Family Medicine

## 2017-09-22 ENCOUNTER — Ambulatory Visit (INDEPENDENT_AMBULATORY_CARE_PROVIDER_SITE_OTHER): Payer: BLUE CROSS/BLUE SHIELD | Admitting: Family Medicine

## 2017-09-22 VITALS — BP 136/82 | HR 76 | Temp 98.2°F | Resp 16 | Ht 67.0 in | Wt 179.7 lb

## 2017-09-22 DIAGNOSIS — Z1211 Encounter for screening for malignant neoplasm of colon: Secondary | ICD-10-CM

## 2017-09-22 DIAGNOSIS — Z0000 Encounter for general adult medical examination without abnormal findings: Secondary | ICD-10-CM

## 2017-09-22 DIAGNOSIS — Z23 Encounter for immunization: Secondary | ICD-10-CM

## 2017-09-22 DIAGNOSIS — Z125 Encounter for screening for malignant neoplasm of prostate: Secondary | ICD-10-CM

## 2017-09-22 DIAGNOSIS — B029 Zoster without complications: Secondary | ICD-10-CM

## 2017-09-22 MED ORDER — VALACYCLOVIR HCL 1 G PO TABS: 1000.0000 mg | ORAL_TABLET | Freq: Three times a day (TID) | ORAL | 0 refills | Status: AC

## 2017-09-22 NOTE — Progress Notes (Signed)
Name: Bryan Green   MRN: [id number]    DOB: 1971/03/03   Date:09/22/2017       Progress Note  Subjective  Chief Complaint  Chief Complaint  Patient presents with  . Annual Exam    HPI  Pt. Presents for Annual Physical Exam. No family history of colon cancer or prostate cancer but father did pass away from 'some form of cancer' similarly grandmother also passed away from 'cancer'.     Past Medical History:  Diagnosis Date  . Depression    pt. does not recall ever being diagnosed with depression, neither being on medications.   . Hypertension     Past Surgical History:  Procedure Laterality Date  . UPPER GI ENDOSCOPY Bilateral 2013    Family History  Problem Relation Age of Onset  . Diabetes Mother   . Cancer Father   . Kidney disease Father   . Hypertension Father     Social History   Socioeconomic History  . Marital status: Single    Spouse name: Not on file  . Number of children: Not on file  . Years of education: Not on file  . Highest education level: Not on file  Social Needs  . Financial resource strain: Not on file  . Food insecurity - worry: Not on file  . Food insecurity - inability: Not on file  . Transportation needs - medical: Not on file  . Transportation needs - non-medical: Not on file  Occupational History  . Not on file  Tobacco Use  . Smoking status: Former Research scientist (life sciences)  . Smokeless tobacco: Never Used  Substance and Sexual Activity  . Alcohol use: No    Alcohol/week: 0.0 oz  . Drug use: No  . Sexual activity: Yes  Other Topics Concern  . Not on file  Social History Narrative  . Not on file     Current Outpatient Medications:  .  chlorthalidone (HYGROTON) 25 MG tablet, Take 1 tablet (25 mg total) by mouth daily., Disp: 90 tablet, Rfl: 0  Allergies  Allergen Reactions  . Lisinopril     Dizziness, headache and drowsy      Review of Systems  Constitutional: Negative for chills, fever and malaise/fatigue.  HENT: Negative for  congestion, ear discharge, ear pain and sore throat.   Eyes: Negative for blurred vision and double vision.  Respiratory: Negative for cough, sputum production and shortness of breath.   Cardiovascular: Negative for chest pain, palpitations (feels palpitations on and off, sometimes associated with acid relfux) and leg swelling.  Gastrointestinal: Negative for abdominal pain, blood in stool, constipation, diarrhea, heartburn, nausea and vomiting.  Genitourinary: Negative for dysuria, hematuria and urgency.  Musculoskeletal: Negative for back pain and neck pain.  Skin: Negative for itching and rash (itchy rash on his left back, ? insect bite).  Neurological: Negative for dizziness and headaches.  Psychiatric/Behavioral: Negative for depression. The patient is not nervous/anxious and does not have insomnia.       Objective  Vitals:   09/22/17 1551  BP: 136/82  Pulse: 76  Resp: 16  Temp: 98.2 F (36.8 C)  TempSrc: Oral  SpO2: 98%  Weight: 179 lb 11.2 oz (81.5 kg)  Height: 5\' 7"  (1.702 m)    Physical Exam  Constitutional: He is oriented to person, place, and time and well-developed, well-nourished, and in no distress.  HENT:  Head: Normocephalic and atraumatic.  Right Ear: External ear normal.  Left Ear: External ear normal.  Cardiovascular: Normal rate, regular  rhythm and normal heart sounds.  No murmur heard. Pulmonary/Chest: Effort normal and breath sounds normal. He has no wheezes.  Abdominal: Soft. Bowel sounds are normal. There is no tenderness.  Genitourinary: Rectum normal and prostate normal. Prostate is not enlarged and not tender.  Musculoskeletal: He exhibits no edema.  Neurological: He is alert and oriented to person, place, and time.  Skin: Rash noted. Rash is macular and pustular.  papulo-pustular rash on the left lateral chest wall, fluid visible within the blisters.    Psychiatric: Mood, memory, affect and judgment normal.  Nursing note and vitals  reviewed.     Recent Results (from the past 2160 hour(s))  COMPLETE METABOLIC PANEL WITH GFR     Status: None   Collection Time: 08/28/17  3:11 PM  Result Value Ref Range   Glucose, Bld 88 65 - 139 mg/dL    Comment: .        Non-fasting reference interval .    BUN 12 7 - 25 mg/dL   Creat 1.10 0.60 - 1.35 mg/dL   GFR, Est Non African American 80 > OR = 60 mL/min/1.37m2   GFR, Est African American 92 > OR = 60 mL/min/1.54m2   BUN/Creatinine Ratio NOT APPLICABLE 6 - 22 (calc)   Sodium 140 135 - 146 mmol/L   Potassium 4.5 3.5 - 5.3 mmol/L   Chloride 101 98 - 110 mmol/L   CO2 29 20 - 32 mmol/L   Calcium 10.0 8.6 - 10.3 mg/dL   Total Protein 7.1 6.1 - 8.1 g/dL   Albumin 4.5 3.6 - 5.1 g/dL   Globulin 2.6 1.9 - 3.7 g/dL (calc)   AG Ratio 1.7 1.0 - 2.5 (calc)   Total Bilirubin 0.3 0.2 - 1.2 mg/dL   Alkaline phosphatase (APISO) 64 40 - 115 U/L   AST 18 10 - 40 U/L   ALT 18 9 - 46 U/L     Assessment & Plan  1. Needs flu shot  - Flu Vaccine QUAD 6+ mos PF IM (Fluarix Quad PF)  2. Annual physical exam Obtain age-appropriate laboratory screening - CBC with Differential/Platelet - COMPLETE METABOLIC PANEL WITH GFR - Lipid panel - TSH - VITAMIN D 25 Hydroxy (Vit-D Deficiency, Fractures)  3. Screen for colon cancer Patient's father passed away from 'some form of cancer', records not available. We will order: Guard for screening - Cologuard  4. Screening for prostate cancer  - PSA  5. Herpes zoster without complication Rash is consistent with herpes zoster outbreak, start on Valtrex, explained to take OTC analgesics for pain relief, if pain persists and her rash gets worse, he should contact us right away. - valACYclovir (VALTREX) 1000 MG tablet; Take 1 tablet (1,000 mg total) 3 (three) times daily for 7 days by mouth.  Dispense: 21 tablet; Refill: 0  Ryen Heitmeyer [facility] 09/22/2017 4:19 PM

## 2017-09-23 ENCOUNTER — Encounter: Payer: Self-pay | Admitting: Family Medicine

## 2017-09-26 ENCOUNTER — Encounter: Payer: BLUE CROSS/BLUE SHIELD | Admitting: Family Medicine

## 2017-10-06 ENCOUNTER — Telehealth: Payer: Self-pay | Admitting: Family Medicine

## 2017-10-06 NOTE — Telephone Encounter (Signed)
Copied from [place] [id number]. Topic: Quick Communication - See Telephone Encounter >> Oct 06, 2017  1:35 PM Bryan Green, NT wrote: CRM for notification. See Telephone encounter for: Pt. Is calling in about the colon test that he was supposed to have insurance want cover it. Pt. Michela Pitcher that they said he had to be 46 years of age to take that kind of test.Pt was wanted to see if the doctor can change the type of colon test that is covered by his insurance. Pt. Would like a call back.  10/06/17.

## 2017-10-20 NOTE — Telephone Encounter (Signed)
Ptt  Made  With   [place]

## 2017-10-20 NOTE — Telephone Encounter (Signed)
Pt  Reports     His   Blood   Pressure  Is   Elevated   His  Is in [place]    And  Is  Getting a  Dot  Physical   He  Has  No  Symptoms  He  Is  Requesting  An appointment  To See  Dr Manuella Ghazi  About  Is  Physical    For  Possible  bp  management

## 2017-10-22 ENCOUNTER — Ambulatory Visit: Payer: Self-pay | Admitting: Family Medicine

## 2017-10-22 ENCOUNTER — Encounter: Payer: Self-pay | Admitting: Family Medicine

## 2017-10-22 ENCOUNTER — Ambulatory Visit (INDEPENDENT_AMBULATORY_CARE_PROVIDER_SITE_OTHER): Payer: BLUE CROSS/BLUE SHIELD | Admitting: Family Medicine

## 2017-10-22 VITALS — BP 142/88 | HR 84 | Temp 98.2°F | Resp 16 | Ht 67.0 in | Wt 176.7 lb

## 2017-10-22 DIAGNOSIS — I10 Essential (primary) hypertension: Secondary | ICD-10-CM

## 2017-10-22 MED ORDER — LOSARTAN POTASSIUM 25 MG PO TABS: 12.5000 mg | ORAL_TABLET | Freq: Every day | ORAL | 0 refills | Status: DC

## 2017-10-22 NOTE — Progress Notes (Signed)
Name: Bryan Green   MRN: [id number]    DOB: September 12, 1971   Date:10/22/2017       Progress Note  Subjective  Chief Complaint  Chief Complaint  Patient presents with  . Hypertension    patient presents today due to his BP being elevated on DOT physical    HPI  Patient presents with concern for elevated BP reading.  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  He has not taken his Chlorthalidone yet today and he is slightly elevated at 142/88, has had ongoing elevated BP readings in our office over the last few months as well.  Denies headaches, dizziness, lightheadedness, chest pain, shortness of breath, or swelling.  Patient Active Problem List   Diagnosis Date Noted  . Hypertension 08/25/2017    Social History   Tobacco Use  . Smoking status: Former Research scientist (life sciences)  . Smokeless tobacco: Never Used  . Tobacco comment: patient stated that he quit 2010  Substance Use Topics  . Alcohol use: No    Alcohol/week: 0.0 oz     Current Outpatient Medications:  .  chlorthalidone (HYGROTON) 25 MG tablet, Take 1 tablet (25 mg total) by mouth daily., Disp: 90 tablet, Rfl: 0  Allergies  Allergen Reactions  . Lisinopril     Dizziness, headache and drowsy     ROS  Constitutional: Negative for fever or weight change.  Respiratory: Negative for cough and shortness of breath.   Cardiovascular: Negative for chest pain or palpitations.  Gastrointestinal: Negative for abdominal pain, no bowel changes.  Musculoskeletal: Negative for gait problem or joint swelling.  Skin: Negative for rash.  Neurological: Negative for dizziness or headache.  No other specific complaints in a complete review of systems (except as listed in HPI above).  Objective  Vitals:   10/22/17 1337  BP: (!) 142/88  Pulse: 84  Resp: 16  Temp: 98.2 F (36.8 C)  TempSrc: Oral  SpO2: 98%  Weight:  176 lb 11.2 oz (80.2 kg)  Height: 5\' 7"  (1.702 m)   Body mass index is 27.68 kg/m.  Nursing Note and Vital Signs reviewed.  Physical Exam  Constitutional: Patient appears well-developed and well-nourished.  No distress.  HEENT: head atraumatic, normocephalic, pupils equal and reactive to light, EOM's intact Cardiovascular: Normal rate, regular rhythm, S1/S2 present.  No murmur or rub heard. No BLE edema. Pulmonary/Chest: Effort normal and breath sounds clear. No respiratory distress or retractions. Psychiatric: Patient has a normal mood and affect. behavior is normal. Judgment and thought content normal.  Recent Results (from the past 2160 hour(s))  COMPLETE METABOLIC PANEL WITH GFR     Status: None   Collection Time: 08/28/17  3:11 PM  Result Value Ref Range   Glucose, Bld 88 65 - 139 mg/dL    Comment: .        Non-fasting reference interval .    BUN 12 7 - 25 mg/dL   Creat 1.10 0.60 - 1.35 mg/dL   GFR, Est Non African American 80 > OR = 60 mL/min/1.70m2   GFR, Est African American 92 > OR = 60 mL/min/1.92m2   BUN/Creatinine Ratio NOT APPLICABLE 6 - 22 (calc)   Sodium 140 135 - 146 mmol/L   Potassium 4.5 3.5 - 5.3 mmol/L   Chloride 101 98 - 110 mmol/L   CO2 29 20 - 32 mmol/L  Calcium 10.0 8.6 - 10.3 mg/dL   Total Protein 7.1 6.1 - 8.1 g/dL   Albumin 4.5 3.6 - 5.1 g/dL   Globulin 2.6 1.9 - 3.7 g/dL (calc)   AG Ratio 1.7 1.0 - 2.5 (calc)   Total Bilirubin 0.3 0.2 - 1.2 mg/dL   Alkaline phosphatase (APISO) 64 40 - 115 U/L   AST 18 10 - 40 U/L   ALT 18 9 - 46 U/L     Assessment & Plan  1. Essential hypertension - losartan (COZAAR) 25 MG tablet; Take 0.5 tablets (12.5 mg total) by mouth daily. 1/2 tablet until BP follow-up.  Dispense: 30 tablet; Refill: 0 - DASH Diet discussed in detail. - Continue Chlorthalidone; patient has concerns about starting new medication at too high of a dose because he developed headaches and dizziness on Lisinopril, so he would like to  start at a low dose of losartan - advised 12.5mg  x3 days, return for BP, will increase to 25mg  if still not controlled or at upper limits of normal.  -Red flags and when to present for emergency care or RTC including fever >101.6F, chest pain, shortness of breath, new/worsening/un-resolving symptoms, reviewed with patient at time of visit. Follow up and care instructions discussed and provided in AVS.

## 2017-10-22 NOTE — Patient Instructions (Addendum)
DASH Eating Plan DASH stands for "Dietary Approaches to Stop Hypertension." The DASH eating plan is a healthy eating plan that has been shown to reduce high blood pressure (hypertension). It may also reduce your risk for type 2 diabetes, heart disease, and stroke. The DASH eating plan may also help with weight loss. What are tips for following this plan? General guidelines  Avoid eating more than 2,300 mg (milligrams) of salt (sodium) a day. If you have hypertension, you may need to reduce your sodium intake to 1,500 mg a day.  Limit alcohol intake to no more than 1 drink a day for nonpregnant women and 2 drinks a day for men. One drink equals 12 oz of beer, 5 oz of wine, or 1 oz of hard liquor.  Work with your health care provider to maintain a healthy body weight or to lose weight. Ask what an ideal weight is for you.  Get at least 30 minutes of exercise that causes your heart to beat faster (aerobic exercise) most days of the week. Activities may include walking, swimming, or biking.  Work with your health care provider or diet and nutrition specialist (dietitian) to adjust your eating plan to your individual calorie needs. Reading food labels  Check food labels for the amount of sodium per serving. Choose foods with less than 5 percent of the Daily Value of sodium. Generally, foods with less than 300 mg of sodium per serving fit into this eating plan.  To find whole grains, look for the word "whole" as the first word in the ingredient list. Shopping  Buy products labeled as "low-sodium" or "no salt added."  Buy fresh foods. Avoid canned foods and premade or frozen meals. Cooking  Avoid adding salt when cooking. Use salt-free seasonings or herbs instead of table salt or sea salt. Check with your health care provider or pharmacist before using salt substitutes.  Do not fry foods. Cook foods using healthy methods such as baking, boiling, grilling, and broiling instead.  Cook with  heart-healthy oils, such as olive, canola, soybean, or sunflower oil. Meal planning   Eat a balanced diet that includes: ? 5 or more servings of fruits and vegetables each day. At each meal, try to fill half of your plate with fruits and vegetables. ? Up to 6-8 servings of whole grains each day. ? Less than 6 oz of lean meat, poultry, or fish each day. A 3-oz serving of meat is about the same size as a deck of cards. One egg equals 1 oz. ? 2 servings of low-fat dairy each day. ? A serving of nuts, seeds, or beans 5 times each week. ? Heart-healthy fats. Healthy fats called Omega-3 fatty acids are found in foods such as flaxseeds and coldwater fish, like sardines, salmon, and mackerel.  Limit how much you eat of the following: ? Canned or prepackaged foods. ? Food that is high in trans fat, such as fried foods. ? Food that is high in saturated fat, such as fatty meat. ? Sweets, desserts, sugary drinks, and other foods with added sugar. ? Full-fat dairy products.  Do not salt foods before eating.  Try to eat at least 2 vegetarian meals each week.  Eat more home-cooked food and less restaurant, buffet, and fast food.  When eating at a restaurant, ask that your food be prepared with less salt or no salt, if possible. What foods are recommended? The items listed may not be a complete list. Talk with your dietitian about what   dietary choices are best for you. Grains Whole-grain or whole-wheat bread. Whole-grain or whole-wheat pasta. Brown rice. Oatmeal. Quinoa. Bulgur. Whole-grain and low-sodium cereals. Pita bread. Low-fat, low-sodium crackers. Whole-wheat flour tortillas. Vegetables Fresh or frozen vegetables (raw, steamed, roasted, or grilled). Low-sodium or reduced-sodium tomato and vegetable juice. Low-sodium or reduced-sodium tomato sauce and tomato paste. Low-sodium or reduced-sodium canned vegetables. Fruits All fresh, dried, or frozen fruit. Canned fruit in natural juice (without  added sugar). Meat and other protein foods Skinless chicken or turkey. Ground chicken or turkey. Pork with fat trimmed off. Fish and seafood. Egg whites. Dried beans, peas, or lentils. Unsalted nuts, nut butters, and seeds. Unsalted canned beans. Lean cuts of beef with fat trimmed off. Low-sodium, lean deli meat. Dairy Low-fat (1%) or fat-free (skim) milk. Fat-free, low-fat, or reduced-fat cheeses. Nonfat, low-sodium ricotta or cottage cheese. Low-fat or nonfat yogurt. Low-fat, low-sodium cheese. Fats and oils Soft margarine without trans fats. Vegetable oil. Low-fat, reduced-fat, or light mayonnaise and salad dressings (reduced-sodium). Canola, safflower, olive, soybean, and sunflower oils. Avocado. Seasoning and other foods Herbs. Spices. Seasoning mixes without salt. Unsalted popcorn and pretzels. Fat-free sweets. What foods are not recommended? The items listed may not be a complete list. Talk with your dietitian about what dietary choices are best for you. Grains Baked goods made with fat, such as croissants, muffins, or some breads. Dry pasta or rice meal packs. Vegetables Creamed or fried vegetables. Vegetables in a cheese sauce. Regular canned vegetables (not low-sodium or reduced-sodium). Regular canned tomato sauce and paste (not low-sodium or reduced-sodium). Regular tomato and vegetable juice (not low-sodium or reduced-sodium). Pickles. Olives. Fruits Canned fruit in a light or heavy syrup. Fried fruit. Fruit in cream or butter sauce. Meat and other protein foods Fatty cuts of meat. Ribs. Fried meat. Bacon. Sausage. Bologna and other processed lunch meats. Salami. Fatback. Hotdogs. Bratwurst. Salted nuts and seeds. Canned beans with added salt. Canned or smoked fish. Whole eggs or egg yolks. Chicken or turkey with skin. Dairy Whole or 2% milk, cream, and half-and-half. Whole or full-fat cream cheese. Whole-fat or sweetened yogurt. Full-fat cheese. Nondairy creamers. Whipped toppings.  Processed cheese and cheese spreads. Fats and oils Butter. Stick margarine. Lard. Shortening. Ghee. Bacon fat. Tropical oils, such as coconut, palm kernel, or palm oil. Seasoning and other foods Salted popcorn and pretzels. Onion salt, garlic salt, seasoned salt, table salt, and sea salt. Worcestershire sauce. Tartar sauce. Barbecue sauce. Teriyaki sauce. Soy sauce, including reduced-sodium. Steak sauce. Canned and packaged gravies. Fish sauce. Oyster sauce. Cocktail sauce. Horseradish that you find on the shelf. Ketchup. Mustard. Meat flavorings and tenderizers. Bouillon cubes. Hot sauce and Tabasco sauce. Premade or packaged marinades. Premade or packaged taco seasonings. Relishes. Regular salad dressings. Where to find more information:  National Heart, Lung, and Blood Institute: www.nhlbi.nih.gov  American Heart Association: www.heart.org Summary  The DASH eating plan is a healthy eating plan that has been shown to reduce high blood pressure (hypertension). It may also reduce your risk for type 2 diabetes, heart disease, and stroke.  With the DASH eating plan, you should limit salt (sodium) intake to 2,300 mg a day. If you have hypertension, you may need to reduce your sodium intake to 1,500 mg a day.  When on the DASH eating plan, aim to eat more fresh fruits and vegetables, whole grains, lean proteins, low-fat dairy, and heart-healthy fats.  Work with your health care provider or diet and nutrition specialist (dietitian) to adjust your eating plan to your individual   calorie needs. This information is not intended to replace advice given to you by your health care provider. Make sure you discuss any questions you have with your health care provider. Document Released: 10/24/2011 Document Revised: 10/28/2016 Document Reviewed: 10/28/2016 Elsevier Interactive Patient Education  2017 Elsevier Inc.  

## 2017-10-24 ENCOUNTER — Ambulatory Visit: Payer: BLUE CROSS/BLUE SHIELD

## 2017-10-24 VITALS — BP 118/78 | HR 70

## 2017-10-24 DIAGNOSIS — I10 Essential (primary) hypertension: Secondary | ICD-10-CM

## 2017-10-24 NOTE — Progress Notes (Signed)
Pt here today for BP check per Raquel Sarna. Pt has no complaints today, denies chest pain, chest tightness,headache,dizziness.

## 2017-11-15 ENCOUNTER — Other Ambulatory Visit: Payer: Self-pay | Admitting: Family Medicine

## 2017-11-15 DIAGNOSIS — I10 Essential (primary) hypertension: Secondary | ICD-10-CM

## 2017-12-12 ENCOUNTER — Other Ambulatory Visit: Payer: Self-pay | Admitting: Family Medicine

## 2017-12-12 DIAGNOSIS — I10 Essential (primary) hypertension: Secondary | ICD-10-CM

## 2018-01-15 ENCOUNTER — Other Ambulatory Visit: Payer: Self-pay

## 2018-01-15 DIAGNOSIS — I10 Essential (primary) hypertension: Secondary | ICD-10-CM

## 2018-01-16 MED ORDER — LOSARTAN POTASSIUM 25 MG PO TABS: 12.5000 mg | ORAL_TABLET | Freq: Every day | ORAL | 0 refills | Status: DC

## 2018-01-16 NOTE — Telephone Encounter (Signed)
Pt notified will be in next week.

## 2018-01-16 NOTE — Telephone Encounter (Signed)
Patient needs Cr and K+; part of multiple outstanding labs that are overdue Please ask patient to have labs done I'll supply limited Rx

## 2018-02-26 ENCOUNTER — Other Ambulatory Visit: Payer: Self-pay

## 2018-02-26 DIAGNOSIS — I10 Essential (primary) hypertension: Secondary | ICD-10-CM

## 2018-02-26 MED ORDER — CHLORTHALIDONE 25 MG PO TABS: 25.0000 mg | ORAL_TABLET | Freq: Every day | ORAL | 0 refills | Status: DC

## 2018-02-26 NOTE — Telephone Encounter (Signed)
Hypertension medication request: Chlorthalidone to [facility].  Last office visit pertaining to hypertension:  10/22/2017   BP Readings from Last 3 Encounters:  10/24/17 118/78  10/22/17 (!) 142/88  09/22/17 136/82    Lab Results  Component Value Date   CREATININE 1.10 08/28/2017   BUN 12 08/28/2017   NA 140 08/28/2017   K 4.5 08/28/2017   CL 101 08/28/2017   CO2 29 08/28/2017     No follow-ups on file.   Patient will need a follow up for more refills.

## 2018-02-26 NOTE — Telephone Encounter (Signed)
LVM for pt to call and schedule an appt °

## 2018-02-26 NOTE — Telephone Encounter (Signed)
Please make appointment for further refills

## 2018-04-09 ENCOUNTER — Other Ambulatory Visit: Payer: Self-pay | Admitting: Family Medicine

## 2018-04-09 ENCOUNTER — Other Ambulatory Visit: Payer: Self-pay | Admitting: Nurse Practitioner

## 2018-04-09 DIAGNOSIS — I10 Essential (primary) hypertension: Secondary | ICD-10-CM

## 2018-04-10 NOTE — Telephone Encounter (Signed)
Please call  ____  Patient needs appointment within the next 2 weeks for refills will give supply up until appointment when created let me know

## 2018-04-20 NOTE — Telephone Encounter (Signed)
Left message for patient to make appointment.

## 2018-04-24 ENCOUNTER — Telehealth: Payer: Self-pay | Admitting: Family Medicine

## 2018-04-24 NOTE — Telephone Encounter (Signed)
LVM for patient to call our office to schedule and appointment for medication refill.  Our office received a refill request for Chlorthalid 25mg , but patient hasn't been seen since 10/2017.

## 2018-04-27 ENCOUNTER — Encounter: Payer: Self-pay | Admitting: Nurse Practitioner

## 2018-04-27 ENCOUNTER — Ambulatory Visit (INDEPENDENT_AMBULATORY_CARE_PROVIDER_SITE_OTHER): Payer: BLUE CROSS/BLUE SHIELD | Admitting: Nurse Practitioner

## 2018-04-27 VITALS — BP 124/80 | HR 64 | Temp 98.4°F | Resp 18 | Ht 67.0 in | Wt 185.0 lb

## 2018-04-27 DIAGNOSIS — E663 Overweight: Secondary | ICD-10-CM

## 2018-04-27 DIAGNOSIS — Z0000 Encounter for general adult medical examination without abnormal findings: Secondary | ICD-10-CM | POA: Diagnosis not present

## 2018-04-27 DIAGNOSIS — Z125 Encounter for screening for malignant neoplasm of prostate: Secondary | ICD-10-CM

## 2018-04-27 DIAGNOSIS — Z114 Encounter for screening for human immunodeficiency virus [HIV]: Secondary | ICD-10-CM | POA: Diagnosis not present

## 2018-04-27 DIAGNOSIS — K219 Gastro-esophageal reflux disease without esophagitis: Secondary | ICD-10-CM | POA: Diagnosis not present

## 2018-04-27 DIAGNOSIS — Z79899 Other long term (current) drug therapy: Secondary | ICD-10-CM | POA: Diagnosis not present

## 2018-04-27 DIAGNOSIS — I10 Essential (primary) hypertension: Secondary | ICD-10-CM | POA: Diagnosis not present

## 2018-04-27 MED ORDER — RANITIDINE HCL 150 MG PO CAPS: 150.0000 mg | ORAL_CAPSULE | Freq: Two times a day (BID) | ORAL | 3 refills | Status: DC

## 2018-04-27 MED ORDER — LOSARTAN POTASSIUM 25 MG PO TABS: 12.5000 mg | ORAL_TABLET | Freq: Every day | ORAL | 0 refills | Status: DC

## 2018-04-27 NOTE — Progress Notes (Addendum)
Name: Bryan Green   MRN: [id number]    DOB: 04/09/1971   Date:04/27/2018       Progress Note  Subjective  Chief Complaint  Chief Complaint  Patient presents with  . Hypertension    follow up, medication refill  . Gastroesophageal Reflux    medication refill    HPI  Hypertension Patient is on chlorthalidone 25mg  and losartan 25mg  daily.  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  Is relatively compliant with low-salt diet- not adding any salt, eating foods lower in salt.  Does not check blood pressures at home. States in general has been more active and has been eating better this last month. Goal is to stay consistent with working out. Patient has been doing 30 minutes 2-3 times a week of cardio, which dumbbells every other night and stretching every day.  Denies chest pain, headaches, blurry vision.  Wt Readings from Last 3 Encounters:  04/27/18 185 lb (83.9 kg)  10/22/17 176 lb 11.2 oz (80.2 kg)  09/22/17 179 lb 11.2 oz (81.5 kg)   GERD Patient endorses heartburn after eating a big meal. States has been trying to regulate it and generally is controlled by diet but has some cheat days. Patient has not been taking medications for a few years but would like to have some available if he needs it.    Patient Active Problem List   Diagnosis Date Noted  . GERD (gastroesophageal reflux disease) 04/27/2018  . Hypertension 08/25/2017    Past Medical History:  Diagnosis Date  . Depression    pt. does not recall ever being diagnosed with depression, neither being on medications.   . Hypertension     Past Surgical History:  Procedure Laterality Date  . UPPER GI ENDOSCOPY Bilateral 2013    Social History   Tobacco Use  . Smoking status: Former Research scientist (life sciences)  . Smokeless tobacco: Never Used  . Tobacco comment: patient stated that he quit 2010  Substance Use Topics  . Alcohol use: No    Alcohol/week: 0.0 oz     Current Outpatient  Medications:  .  chlorthalidone (HYGROTON) 25 MG tablet, Take 1 tablet (25 mg total) by mouth daily., Disp: 30 tablet, Rfl: 0 .  losartan (COZAAR) 25 MG tablet, Take 0.5 tablets (12.5 mg total) by mouth daily. 1/2 tablet until BP follow-up., Disp: 15 tablet, Rfl: 0  Allergies  Allergen Reactions  . Lisinopril     Dizziness, headache and drowsy     ROS   No other specific complaints in a complete review of systems (except as listed in HPI above).  Objective  Vitals:   04/27/18 0818  BP: 124/80  Pulse: 64  Resp: 18  Temp: 98.4 F (36.9 C)  TempSrc: Oral  SpO2: 96%  Weight: 185 lb (83.9 kg)  Height: 5\' 7"  (1.702 m)    Body mass index is 28.98 kg/m.  Nursing Note and Vital Signs reviewed.  Physical Exam  Constitutional: Patient appears well-developed and well-nourished. No distress.  Cardiovascular: Normal rate, regular rhythm, S1/S2 present.  No murmur or rub heard.  Pulmonary/Chest: Effort normal and breath sounds clear. No respiratory distress or retractions. Psychiatric: Patient has a normal mood and affect. behavior is normal. Judgment and thought content normal.  No results found for this or any previous visit (from the past 72 hour(s)).  Assessment & Plan  1. Essential hypertension - DASH, continue dieting and exercise - Basic Metabolic  Panel (BMET) - losartan (COZAAR) 25 MG tablet; Take 0.5 tablets (12.5 mg total) by mouth daily. 1/2 tablet until BP follow-up.  Dispense: 15 tablet; Refill: 0  2. Medication management  - Basic Metabolic Panel (BMET)  3. Gastroesophageal reflux disease without esophagitis -discussed diet, small frequent meals, avoiding triggers, TUMS PRN and then if needed can take ranitidine - ranitidine (ZANTAC) 150 MG capsule; Take 1 capsule (150 mg total) by mouth 2 (two) times daily.  Dispense: 60 capsule; Refill: 3  4. Overweight (BMI 25.0-29.9) Cont improving diet and exercise  5. Screening for HIV (human immunodeficiency  virus)  - HIV antibody (with reflex)  Face-to-face time with patient was more than 25 minutes, >50% time spent counseling and coordination of care -Red flags and when to present for emergency care or RTC including fever >101.72F, chest pain, shortness of breath,  reviewed with patient at time of visit. Follow up and care instructions discussed and provided in AVS. -Reviewed Health Maintenance: HIV updated, cologuard resent   -------------------------------------------- I have reviewed this encounter including the documentation in this note and/or discussed this patient with the provider, Suezanne Cheshire DNP AGNP-C. I am certifying that I agree with the content of this note as supervising physician. Enid Derry, [facility] 05/01/2018, 5:49 PM

## 2018-04-27 NOTE — Patient Instructions (Addendum)
Hypertension: - Can check BP at drug store 2 times  a week or buy a blood pressure cuff (can get one at [facility]) and be aware of your blood pressure - Restarting losartan - Follow up in one month   Acid Reflux: - Eat smaller meals, more frequently  - Avoiding acidic foods and if you know you're going to eat them take a tums 30 minutes before  - If that doesn't help can take zantac   DASH Eating Plan DASH stands for "Dietary Approaches to Stop Hypertension." The DASH eating plan is a healthy eating plan that has been shown to reduce high blood pressure (hypertension). It may also reduce your risk for type 2 diabetes, heart disease, and stroke. The DASH eating plan may also help with weight loss. What are tips for following this plan? General guidelines  Avoid eating more than 2,300 mg (milligrams) of salt (sodium) a day. If you have hypertension, you may need to reduce your sodium intake to 1,500 mg a day.  Limit alcohol intake to no more than 1 drink a day for nonpregnant women and 2 drinks a day for men. One drink equals 12 oz of beer, 5 oz of wine, or 1 oz of hard liquor.  Work with your health care provider to maintain a healthy body weight or to lose weight. Ask what an ideal weight is for you.  Get at least 30 minutes of exercise that causes your heart to beat faster (aerobic exercise) most days of the week. Activities may include walking, swimming, or biking.  Work with your health care provider or diet and nutrition specialist (dietitian) to adjust your eating plan to your individual calorie needs. Reading food labels  Check food labels for the amount of sodium per serving. Choose foods with less than 5 percent of the Daily Value of sodium. Generally, foods with less than 300 mg of sodium per serving fit into this eating plan.  To find whole grains, look for the word "whole" as the first word in the ingredient list. Shopping  Buy products labeled as "low-sodium" or "no  salt added."  Buy fresh foods. Avoid canned foods and premade or frozen meals. Cooking  Avoid adding salt when cooking. Use salt-free seasonings or herbs instead of table salt or sea salt. Check with your health care provider or pharmacist before using salt substitutes.  Do not fry foods. Cook foods using healthy methods such as baking, boiling, grilling, and broiling instead.  Cook with heart-healthy oils, such as olive, canola, soybean, or sunflower oil. Meal planning   Eat a balanced diet that includes: ? 5 or more servings of fruits and vegetables each day. At each meal, try to fill half of your plate with fruits and vegetables. ? Up to 6-8 servings of whole grains each day. ? Less than 6 oz of lean meat, poultry, or fish each day. A 3-oz serving of meat is about the same size as a deck of cards. One egg equals 1 oz. ? 2 servings of low-fat dairy each day. ? A serving of nuts, seeds, or beans 5 times each week. ? Heart-healthy fats. Healthy fats called Omega-3 fatty acids are found in foods such as flaxseeds and coldwater fish, like sardines, salmon, and mackerel.  Limit how much you eat of the following: ? Canned or prepackaged foods. ? Food that is high in trans fat, such as fried foods. ? Food that is high in saturated fat, such as fatty meat. ? Sweets,  desserts, sugary drinks, and other foods with added sugar. ? Full-fat dairy products.  Do not salt foods before eating.  Try to eat at least 2 vegetarian meals each week.  Eat more home-cooked food and less restaurant, buffet, and fast food.  When eating at a restaurant, ask that your food be prepared with less salt or no salt, if possible. What foods are recommended? The items listed may not be a complete list. Talk with your dietitian about what dietary choices are best for you. Grains Whole-grain or whole-wheat bread. Whole-grain or whole-wheat pasta. Brown rice. [place]. Bulgur. Whole-grain and low-sodium  cereals. Pita bread. Low-fat, low-sodium crackers. Whole-wheat flour tortillas. Vegetables Fresh or frozen vegetables (raw, steamed, roasted, or grilled). Low-sodium or reduced-sodium tomato and vegetable juice. Low-sodium or reduced-sodium tomato sauce and tomato paste. Low-sodium or reduced-sodium canned vegetables. Fruits All fresh, dried, or frozen fruit. Canned fruit in natural juice (without added sugar). Meat and other protein foods Skinless chicken or [place]. Ground chicken or [place]. Pork with fat trimmed off. Fish and seafood. Egg whites. Dried beans, peas, or lentils. Unsalted nuts, nut butters, and seeds. Unsalted canned beans. Lean cuts of beef with fat trimmed off. Low-sodium, lean deli meat. Dairy Low-fat (1%) or fat-free (skim) milk. Fat-free, low-fat, or reduced-fat cheeses. Nonfat, low-sodium ricotta or cottage cheese. Low-fat or nonfat yogurt. Low-fat, low-sodium cheese. Fats and oils Soft margarine without trans fats. Vegetable oil. Low-fat, reduced-fat, or light mayonnaise and salad dressings (reduced-sodium). Canola, safflower, olive, soybean, and sunflower oils. Avocado. Seasoning and other foods Herbs. Spices. Seasoning mixes without salt. Unsalted popcorn and pretzels. Fat-free sweets. What foods are not recommended? The items listed may not be a complete list. Talk with your dietitian about what dietary choices are best for you. Grains Baked goods made with fat, such as croissants, muffins, or some breads. Dry pasta or rice meal packs. Vegetables Creamed or fried vegetables. Vegetables in a cheese sauce. Regular canned vegetables (not low-sodium or reduced-sodium). Regular canned tomato sauce and paste (not low-sodium or reduced-sodium). Regular tomato and vegetable juice (not low-sodium or reduced-sodium). Angie Fava. Olives. Fruits Canned fruit in a light or heavy syrup. Fried fruit. Fruit in cream or butter sauce. Meat and other protein foods Fatty cuts of meat. Ribs.  Fried meat. Berniece Salines. Sausage. Bologna and other processed lunch meats. Salami. Fatback. Hotdogs. Bratwurst. Salted nuts and seeds. Canned beans with added salt. Canned or smoked fish. Whole eggs or egg yolks. Chicken or [place] with skin. Dairy Whole or 2% milk, cream, and half-and-half. Whole or full-fat cream cheese. Whole-fat or sweetened yogurt. Full-fat cheese. Nondairy creamers. Whipped toppings. Processed cheese and cheese spreads. Fats and oils Butter. Stick margarine. Lard. Shortening. Ghee. Bacon fat. Tropical oils, such as coconut, palm kernel, or palm oil. Seasoning and other foods Salted popcorn and pretzels. Onion salt, garlic salt, seasoned salt, table salt, and sea salt. Worcestershire sauce. Tartar sauce. Barbecue sauce. Teriyaki sauce. Soy sauce, including reduced-sodium. Steak sauce. Canned and packaged gravies. Fish sauce. Oyster sauce. Cocktail sauce. Horseradish that you find on the shelf. Ketchup. Mustard. Meat flavorings and tenderizers. Bouillon cubes. Hot sauce and Tabasco sauce. Premade or packaged marinades. Premade or packaged taco seasonings. Relishes. Regular salad dressings. Where to find more information:  National Heart, Lung, and [place]: https://wilson-eaton.com/  American Heart Association: www.heart.org Summary  The DASH eating plan is a healthy eating plan that has been shown to reduce high blood pressure (hypertension). It may also reduce your risk for type 2 diabetes, heart  disease, and stroke.  With the DASH eating plan, you should limit salt (sodium) intake to 2,300 mg a day. If you have hypertension, you may need to reduce your sodium intake to 1,500 mg a day.  When on the DASH eating plan, aim to eat more fresh fruits and vegetables, whole grains, lean proteins, low-fat dairy, and heart-healthy fats.  Work with your health care provider or diet and nutrition specialist (dietitian) to adjust your eating plan to your individual calorie needs. This  information is not intended to replace advice given to you by your health care provider. Make sure you discuss any questions you have with your health care provider. Document Released: 10/24/2011 Document Revised: 10/28/2016 Document Reviewed: 10/28/2016 Elsevier Interactive Patient Education  2018 [place] for Gastroesophageal Reflux Disease, Adult When you have gastroesophageal reflux disease (GERD), the foods you eat and your eating habits are very important. Choosing the right foods can help ease your discomfort. What guidelines do I need to follow?  Choose fruits, vegetables, whole grains, and low-fat dairy products.  Choose low-fat meat, fish, and poultry.  Limit fats such as oils, salad dressings, butter, nuts, and avocado.  Keep a food diary. This helps you identify foods that cause symptoms.  Avoid foods that cause symptoms. These may be different for everyone.  Eat small meals often instead of 3 large meals a day.  Eat your meals slowly, in a place where you are relaxed.  Limit fried foods.  Cook foods using methods other than frying.  Avoid drinking alcohol.  Avoid drinking large amounts of liquids with your meals.  Avoid bending over or lying down until 2-3 hours after eating. What foods are not recommended? These are some foods and drinks that may make your symptoms worse: Vegetables Tomatoes. Tomato juice. Tomato and spaghetti sauce. Chili peppers. Onion and garlic. Horseradish. Fruits Oranges, grapefruit, and lemon (fruit and juice). Meats High-fat meats, fish, and poultry. This includes hot dogs, ribs, ham, sausage, salami, and bacon. Dairy Whole milk and chocolate milk. Sour cream. Cream. Butter. Ice cream. Cream cheese. Drinks Coffee and tea. Bubbly (carbonated) drinks or energy drinks. Condiments Hot sauce. Barbecue sauce. Sweets/Desserts Chocolate and cocoa. Donuts. Peppermint and spearmint. Fats and Oils High-fat foods. This  includes [place] fries and potato chips. Other Vinegar. Strong spices. This includes black pepper, white pepper, red pepper, cayenne, curry powder, cloves, ginger, and chili powder. The items listed above may not be a complete list of foods and drinks to avoid. Contact your dietitian for more information. This information is not intended to replace advice given to you by your health care provider. Make sure you discuss any questions you have with your health care provider. Document Released: 05/05/2012 Document Revised: 04/11/2016 Document Reviewed: 09/08/2013 Elsevier Interactive Patient Education  2017 [employer].

## 2018-04-28 ENCOUNTER — Other Ambulatory Visit: Payer: Self-pay | Admitting: Nurse Practitioner

## 2018-04-28 DIAGNOSIS — I10 Essential (primary) hypertension: Secondary | ICD-10-CM

## 2018-04-28 LAB — CBC WITH DIFFERENTIAL/PLATELET
BASOS PCT: 1.4 %
Basophils Absolute: 62 cells/uL (ref 0–200)
EOS PCT: 4.6 %
Eosinophils Absolute: 202 cells/uL (ref 15–500)
HEMATOCRIT: 40.3 % (ref 38.5–50.0)
HEMOGLOBIN: 13.7 g/dL (ref 13.2–17.1)
LYMPHS ABS: 1179 {cells}/uL (ref 850–3900)
MCH: 27.5 pg (ref 27.0–33.0)
MCHC: 34 g/dL (ref 32.0–36.0)
MCV: 80.8 fL (ref 80.0–100.0)
MPV: 11.3 fL (ref 7.5–12.5)
Monocytes Relative: 9.6 %
NEUTROS ABS: 2534 {cells}/uL (ref 1500–7800)
Neutrophils Relative %: 57.6 %
Platelets: 234 10*3/uL (ref 140–400)
RBC: 4.99 10*6/uL (ref 4.20–5.80)
RDW: 12.9 % (ref 11.0–15.0)
Total Lymphocyte: 26.8 %
WBC mixed population: 422 cells/uL (ref 200–950)
WBC: 4.4 10*3/uL (ref 3.8–10.8)

## 2018-04-28 LAB — COMPLETE METABOLIC PANEL WITH GFR
AG Ratio: 1.8 (calc) (ref 1.0–2.5)
ALBUMIN MSPROF: 4.4 g/dL (ref 3.6–5.1)
ALKALINE PHOSPHATASE (APISO): 61 U/L (ref 40–115)
ALT: 30 U/L (ref 9–46)
AST: 34 U/L (ref 10–40)
BUN: 14 mg/dL (ref 7–25)
CO2: 30 mmol/L (ref 20–32)
CREATININE: 1.24 mg/dL (ref 0.60–1.35)
Calcium: 9.7 mg/dL (ref 8.6–10.3)
Chloride: 105 mmol/L (ref 98–110)
GFR, EST AFRICAN AMERICAN: 79 mL/min/{1.73_m2} (ref >=60)
GFR, Est Non African American: 68 mL/min/{1.73_m2} (ref >=60)
GLUCOSE: 86 mg/dL (ref 65–99)
Globulin: 2.4 g/dL (calc) (ref 1.9–3.7)
Potassium: 4.4 mmol/L (ref 3.5–5.3)
Sodium: 142 mmol/L (ref 135–146)
TOTAL PROTEIN: 6.8 g/dL (ref 6.1–8.1)
Total Bilirubin: 0.5 mg/dL (ref 0.2–1.2)

## 2018-04-28 LAB — LIPID PANEL
Cholesterol: 195 mg/dL (ref <200)
HDL: 49 mg/dL (ref >40)
LDL Cholesterol (Calc): 134 mg/dL (calc) — ABNORMAL HIGH
Non-HDL Cholesterol (Calc): 146 mg/dL (calc) — ABNORMAL HIGH (ref <130)
Total CHOL/HDL Ratio: 4 (calc) (ref <5.0)
Triglycerides: 41 mg/dL (ref <150)

## 2018-04-28 LAB — TSH: TSH: 0.76 m[IU]/L (ref 0.40–4.50)

## 2018-04-28 LAB — VITAMIN D 25 HYDROXY (VIT D DEFICIENCY, FRACTURES): Vit D, 25-Hydroxy: 39 ng/mL (ref 30–100)

## 2018-04-28 LAB — HIV ANTIBODY (ROUTINE TESTING W REFLEX): HIV 1&2 Ab, 4th Generation: NONREACTIVE

## 2018-04-28 LAB — PSA: PSA: 1.1 ng/mL (ref <=4.0)

## 2018-05-27 ENCOUNTER — Telehealth: Payer: Self-pay | Admitting: Family Medicine

## 2018-05-27 NOTE — Telephone Encounter (Signed)
Copied from [place] [id number]. Topic: Quick Communication - See Telephone Encounter >> May 27, 2018  3:49 PM Rutherford Nail, NT wrote: CRM for notification. See Telephone encounter for: 05/27/18. Larene Beach at [employer] and states that Dr Manuella Ghazi placed a colo guard back in 09/2017 and another one was placed by Suezanne Cheshire on 04/27/18. Would like to know what order to cancel. Please advise. CB#: [phone] option 2 for provider support.   Order #: [id number] Manuella Ghazi)                [id number] Cecil Cranker)

## 2018-05-28 NOTE — Telephone Encounter (Signed)
Spoke to [place] and she took out the order under Dr. Manuella Ghazi name since he is no longer a provider here and will use the second order for [facility] NP

## 2018-05-30 ENCOUNTER — Telehealth: Payer: Self-pay | Admitting: Nurse Practitioner

## 2018-05-30 ENCOUNTER — Other Ambulatory Visit: Payer: Self-pay | Admitting: Nurse Practitioner

## 2018-05-30 DIAGNOSIS — I10 Essential (primary) hypertension: Secondary | ICD-10-CM

## 2018-05-30 NOTE — Telephone Encounter (Signed)
Can you please clarify what blood pressure medications patient is on? Just losartan or taking chlorthalidone as well and dosages.

## 2018-06-01 NOTE — Telephone Encounter (Signed)
Called patient he is taking Losartan 25 mg. It was decreased because his blood pressure was doing well.

## 2018-06-02 ENCOUNTER — Encounter: Payer: Self-pay | Admitting: Family Medicine

## 2018-06-02 ENCOUNTER — Ambulatory Visit: Payer: BLUE CROSS/BLUE SHIELD | Admitting: Family Medicine

## 2018-06-02 VITALS — BP 118/72 | HR 73 | Temp 98.4°F | Resp 16 | Ht 67.0 in | Wt 184.8 lb

## 2018-06-02 DIAGNOSIS — E7800 Pure hypercholesterolemia, unspecified: Secondary | ICD-10-CM | POA: Diagnosis not present

## 2018-06-02 DIAGNOSIS — K219 Gastro-esophageal reflux disease without esophagitis: Secondary | ICD-10-CM | POA: Diagnosis not present

## 2018-06-02 DIAGNOSIS — I10 Essential (primary) hypertension: Secondary | ICD-10-CM

## 2018-06-02 DIAGNOSIS — E785 Hyperlipidemia, unspecified: Secondary | ICD-10-CM | POA: Insufficient documentation

## 2018-06-02 MED ORDER — LOSARTAN POTASSIUM 25 MG PO TABS: 25.0000 mg | ORAL_TABLET | Freq: Every day | ORAL | 1 refills | Status: DC

## 2018-06-02 NOTE — Progress Notes (Signed)
Name: Bryan Green   MRN: [id number]    DOB: [birth year]   Date:06/02/2018       Progress Note  Subjective  Chief Complaint  Chief Complaint  Patient presents with  . Hypertension    follow up    HPI  Pt presents for HTN follow up - he stopped chlorthalidone at last visit in June 2019. He has been taking 25mg  Losartan alone and is feeling well.  Denies chest pain, shortness of breath, headaches, swelling, vision changes. Last CMP on 04/27/2018 was WNL.  BP is at goal today, he has been focusing on avoiding salt, though he still eats salted peanuts while driving sometimes.  Elevated LDL - last check was 04/27/2018; he has been focusing on his diet. LDL was 134.  He denies chest pain, shortness of breath.  We will recheck at his 6 month follow up.  GERD: Doing well with dietary changes; taking zantac only as needed.  Denies regurgitation, choking, abdominal pain, vomiting, or stool changes.  Patient Active Problem List   Diagnosis Date Noted  . GERD (gastroesophageal reflux disease) 04/27/2018  . Hypertension 08/25/2017    Social History   Tobacco Use  . Smoking status: Former Research scientist (life sciences)  . Smokeless tobacco: Never Used  . Tobacco comment: patient stated that he quit 2010  Substance Use Topics  . Alcohol use: No    Alcohol/week: 0.0 oz     Current Outpatient Medications:  .  losartan (COZAAR) 25 MG tablet, TAKE 0.5 TABLETS (12.5 MG TOTAL) BY MOUTH DAILY. 1/2 TABLET UNTIL BP FOLLOW-UP., Disp: 15 tablet, Rfl: 0 .  ranitidine (ZANTAC) 150 MG capsule, Take 1 capsule (150 mg total) by mouth 2 (two) times daily. (Patient not taking: Reported on 06/02/2018), Disp: 60 capsule, Rfl: 3  Allergies  Allergen Reactions  . Lisinopril     Dizziness, headache and drowsy     ROS  Constitutional: Negative for fever or weight change.  Respiratory: Negative for cough and shortness of breath.   Cardiovascular: Negative for chest pain or palpitations.  Gastrointestinal: Negative for abdominal  pain, no bowel changes.  Musculoskeletal: Negative for gait problem or joint swelling.  Skin: Negative for rash.  Neurological: Negative for dizziness or headache.  No other specific complaints in a complete review of systems (except as listed in HPI above).  Objective  Vitals:   06/02/18 0810  BP: 118/72  Pulse: 73  Resp: 16  Temp: 98.4 F (36.9 C)  TempSrc: Oral  SpO2: 99%  Weight: 184 lb 12.8 oz (83.8 kg)  Height: 5\' 7"  (1.702 m)   Body mass index is 28.94 kg/m.  Nursing Note and Vital Signs reviewed.  Physical Exam  Constitutional: Patient appears well-developed and well-nourished.  No distress.  HEENT: head atraumatic, normocephalic Cardiovascular: Normal rate, regular rhythm, S1/S2 present.  No murmur or rub heard. No BLE edema. Pulmonary/Chest: Effort normal and breath sounds clear. No respiratory distress or retractions. Psychiatric: Patient has a normal mood and affect. behavior is normal. Judgment and thought content normal.  No results found for this or any previous visit (from the past 72 hour(s)).  Assessment & Plan  1. Essential hypertension - DASH diet discussed - losartan (COZAAR) 25 MG tablet; Take 1 tablet (25 mg total) by mouth daily. 1/2 tablet until BP follow-up.  Dispense: 90 tablet; Refill: 1  2. Gastroesophageal reflux disease without esophagitis - Stable, Zantac PRN  3. Elevated LDL cholesterol level - Continue to work on diet and exercise. Will recheck fasting  labs in 6 months.

## 2018-06-02 NOTE — Telephone Encounter (Signed)
error 

## 2018-06-02 NOTE — Patient Instructions (Signed)
DASH Eating Plan DASH stands for "Dietary Approaches to Stop Hypertension." The DASH eating plan is a healthy eating plan that has been shown to reduce high blood pressure (hypertension). It may also reduce your risk for type 2 diabetes, heart disease, and stroke. The DASH eating plan may also help with weight loss. What are tips for following this plan? General guidelines  Avoid eating more than 2,300 mg (milligrams) of salt (sodium) a day. If you have hypertension, you may need to reduce your sodium intake to 1,500 mg a day.  Limit alcohol intake to no more than 1 drink a day for nonpregnant women and 2 drinks a day for men. One drink equals 12 oz of beer, 5 oz of wine, or 1 oz of hard liquor.  Work with your health care provider to maintain a healthy body weight or to lose weight. Ask what an ideal weight is for you.  Get at least 30 minutes of exercise that causes your heart to beat faster (aerobic exercise) most days of the week. Activities may include walking, swimming, or biking.  Work with your health care provider or diet and nutrition specialist (dietitian) to adjust your eating plan to your individual calorie needs. Reading food labels  Check food labels for the amount of sodium per serving. Choose foods with less than 5 percent of the Daily Value of sodium. Generally, foods with less than 300 mg of sodium per serving fit into this eating plan.  To find whole grains, look for the word "whole" as the first word in the ingredient list. Shopping  Buy products labeled as "low-sodium" or "no salt added."  Buy fresh foods. Avoid canned foods and premade or frozen meals. Cooking  Avoid adding salt when cooking. Use salt-free seasonings or herbs instead of table salt or sea salt. Check with your health care provider or pharmacist before using salt substitutes.  Do not fry foods. Cook foods using healthy methods such as baking, boiling, grilling, and broiling instead.  Cook with  heart-healthy oils, such as olive, canola, soybean, or sunflower oil. Meal planning   Eat a balanced diet that includes: ? 5 or more servings of fruits and vegetables each day. At each meal, try to fill half of your plate with fruits and vegetables. ? Up to 6-8 servings of whole grains each day. ? Less than 6 oz of lean meat, poultry, or fish each day. A 3-oz serving of meat is about the same size as a deck of cards. One egg equals 1 oz. ? 2 servings of low-fat dairy each day. ? A serving of nuts, seeds, or beans 5 times each week. ? Heart-healthy fats. Healthy fats called Omega-3 fatty acids are found in foods such as flaxseeds and coldwater fish, like sardines, salmon, and mackerel.  Limit how much you eat of the following: ? Canned or prepackaged foods. ? Food that is high in trans fat, such as fried foods. ? Food that is high in saturated fat, such as fatty meat. ? Sweets, desserts, sugary drinks, and other foods with added sugar. ? Full-fat dairy products.  Do not salt foods before eating.  Try to eat at least 2 vegetarian meals each week.  Eat more home-cooked food and less restaurant, buffet, and fast food.  When eating at a restaurant, ask that your food be prepared with less salt or no salt, if possible. What foods are recommended? The items listed may not be a complete list. Talk with your dietitian about what   dietary choices are best for you. Grains Whole-grain or whole-wheat bread. Whole-grain or whole-wheat pasta. Brown rice. Oatmeal. Quinoa. Bulgur. Whole-grain and low-sodium cereals. Pita bread. Low-fat, low-sodium crackers. Whole-wheat flour tortillas. Vegetables Fresh or frozen vegetables (raw, steamed, roasted, or grilled). Low-sodium or reduced-sodium tomato and vegetable juice. Low-sodium or reduced-sodium tomato sauce and tomato paste. Low-sodium or reduced-sodium canned vegetables. Fruits All fresh, dried, or frozen fruit. Canned fruit in natural juice (without  added sugar). Meat and other protein foods Skinless chicken or turkey. Ground chicken or turkey. Pork with fat trimmed off. Fish and seafood. Egg whites. Dried beans, peas, or lentils. Unsalted nuts, nut butters, and seeds. Unsalted canned beans. Lean cuts of beef with fat trimmed off. Low-sodium, lean deli meat. Dairy Low-fat (1%) or fat-free (skim) milk. Fat-free, low-fat, or reduced-fat cheeses. Nonfat, low-sodium ricotta or cottage cheese. Low-fat or nonfat yogurt. Low-fat, low-sodium cheese. Fats and oils Soft margarine without trans fats. Vegetable oil. Low-fat, reduced-fat, or light mayonnaise and salad dressings (reduced-sodium). Canola, safflower, olive, soybean, and sunflower oils. Avocado. Seasoning and other foods Herbs. Spices. Seasoning mixes without salt. Unsalted popcorn and pretzels. Fat-free sweets. What foods are not recommended? The items listed may not be a complete list. Talk with your dietitian about what dietary choices are best for you. Grains Baked goods made with fat, such as croissants, muffins, or some breads. Dry pasta or rice meal packs. Vegetables Creamed or fried vegetables. Vegetables in a cheese sauce. Regular canned vegetables (not low-sodium or reduced-sodium). Regular canned tomato sauce and paste (not low-sodium or reduced-sodium). Regular tomato and vegetable juice (not low-sodium or reduced-sodium). Pickles. Olives. Fruits Canned fruit in a light or heavy syrup. Fried fruit. Fruit in cream or butter sauce. Meat and other protein foods Fatty cuts of meat. Ribs. Fried meat. Bacon. Sausage. Bologna and other processed lunch meats. Salami. Fatback. Hotdogs. Bratwurst. Salted nuts and seeds. Canned beans with added salt. Canned or smoked fish. Whole eggs or egg yolks. Chicken or turkey with skin. Dairy Whole or 2% milk, cream, and half-and-half. Whole or full-fat cream cheese. Whole-fat or sweetened yogurt. Full-fat cheese. Nondairy creamers. Whipped toppings.  Processed cheese and cheese spreads. Fats and oils Butter. Stick margarine. Lard. Shortening. Ghee. Bacon fat. Tropical oils, such as coconut, palm kernel, or palm oil. Seasoning and other foods Salted popcorn and pretzels. Onion salt, garlic salt, seasoned salt, table salt, and sea salt. Worcestershire sauce. Tartar sauce. Barbecue sauce. Teriyaki sauce. Soy sauce, including reduced-sodium. Steak sauce. Canned and packaged gravies. Fish sauce. Oyster sauce. Cocktail sauce. Horseradish that you find on the shelf. Ketchup. Mustard. Meat flavorings and tenderizers. Bouillon cubes. Hot sauce and Tabasco sauce. Premade or packaged marinades. Premade or packaged taco seasonings. Relishes. Regular salad dressings. Where to find more information:  National Heart, Lung, and Blood Institute: www.nhlbi.nih.gov  American Heart Association: www.heart.org Summary  The DASH eating plan is a healthy eating plan that has been shown to reduce high blood pressure (hypertension). It may also reduce your risk for type 2 diabetes, heart disease, and stroke.  With the DASH eating plan, you should limit salt (sodium) intake to 2,300 mg a day. If you have hypertension, you may need to reduce your sodium intake to 1,500 mg a day.  When on the DASH eating plan, aim to eat more fresh fruits and vegetables, whole grains, lean proteins, low-fat dairy, and heart-healthy fats.  Work with your health care provider or diet and nutrition specialist (dietitian) to adjust your eating plan to your individual   calorie needs. This information is not intended to replace advice given to you by your health care provider. Make sure you discuss any questions you have with your health care provider. Document Released: 10/24/2011 Document Revised: 10/28/2016 Document Reviewed: 10/28/2016 Elsevier Interactive Patient Education  2018 Elsevier Inc[employer] Fat and Cholesterol Restricted Diet Getting too much fat and cholesterol in your diet may  cause health problems. Following this diet helps keep your fat and cholesterol at normal levels. This can keep you from getting sick. What types of fat should I choose?  Choose monosaturated and polyunsaturated fats. These are found in foods such as olive oil, canola oil, flaxseeds, walnuts, almonds, and seeds.  Eat more omega-3 fats. Good choices include salmon, mackerel, sardines, tuna, flaxseed oil, and ground flaxseeds.  Limit saturated fats. These are in animal products such as meats, butter, and cream. They can also be in plant products such as palm oil, palm kernel oil, and coconut oil.  Avoid foods with partially hydrogenated oils in them. These contain trans fats. Examples of foods that have trans fats are stick margarine, some tub margarines, cookies, crackers, and other baked goods. What general guidelines do I need to follow?  Check food labels. Look for the words "trans fat" and "saturated fat."  When preparing a meal: ? Fill half of your plate with vegetables and green salads. ? Fill one fourth of your plate with whole grains. Look for the word "whole" as the first word in the ingredient list. ? Fill one fourth of your plate with lean protein foods.  Eat more foods that have fiber, like apples, carrots, beans, peas, and barley.  Eat more home-cooked foods. Eat less at restaurants and buffets.  Limit or avoid alcohol.  Limit foods high in starch and sugar.  Limit fried foods.  Cook foods without frying them. Baking, boiling, grilling, and broiling are all great options.  Lose weight if you are overweight. Losing even a small amount of weight can help your overall health. It can also help prevent diseases such as diabetes and heart disease. What foods can I eat? Grains Whole grains, such as whole wheat or whole grain breads, crackers, cereals, and pasta. Unsweetened oatmeal, bulgur, barley, quinoa, or brown rice. Corn or whole wheat flour tortillas. Vegetables Fresh or  frozen vegetables (raw, steamed, roasted, or grilled). Green salads. Fruits All fresh, canned (in natural juice), or frozen fruits. Meat and Other Protein Products Ground beef (85% or leaner), grass-fed beef, or beef trimmed of fat. Skinless chicken or turkey. Ground chicken or turkey. Pork trimmed of fat. All fish and seafood. Eggs. Dried beans, peas, or lentils. Unsalted nuts or seeds. Unsalted canned or dry beans. Dairy Low-fat dairy products, such as skim or 1% milk, 2% or reduced-fat cheeses, low-fat ricotta or cottage cheese, or plain low-fat yogurt. Fats and Oils Tub margarines without trans fats. Light or reduced-fat mayonnaise and salad dressings. Avocado. Olive, canola, sesame, or safflower oils. Natural peanut or almond butter (choose ones without added sugar and oil). The items listed above may not be a complete list of recommended foods or beverages. Contact your dietitian for more options. What foods are not recommended? Grains White bread. White pasta. White rice. Cornbread. Bagels, pastries, and croissants. Crackers that contain trans fat. Vegetables White potatoes. Corn. Creamed or fried vegetables. Vegetables in a cheese sauce. Fruits Dried fruits. Canned fruit in light or heavy syrup. Fruit juice. Meat and Other Protein Products Fatty cuts of meat. Ribs, chicken wings, bacon, sausage, bologna,   salami, chitterlings, fatback, hot dogs, bratwurst, and packaged luncheon meats. Liver and organ meats. Dairy Whole or 2% milk, cream, half-and-half, and cream cheese. Whole milk cheeses. Whole-fat or sweetened yogurt. Full-fat cheeses. Nondairy creamers and whipped toppings. Processed cheese, cheese spreads, or cheese curds. Sweets and Desserts Corn syrup, sugars, honey, and molasses. Candy. Jam and jelly. Syrup. Sweetened cereals. Cookies, pies, cakes, donuts, muffins, and ice cream. Fats and Oils Butter, stick margarine, lard, shortening, ghee, or bacon fat. Coconut, palm kernel,  or palm oils. Beverages Alcohol. Sweetened drinks (such as sodas, lemonade, and fruit drinks or punches). The items listed above may not be a complete list of foods and beverages to avoid. Contact your dietitian for more information. This information is not intended to replace advice given to you by your health care provider. Make sure you discuss any questions you have with your health care provider. Document Released: 05/05/2012 Document Revised: 07/11/2016 Document Reviewed: 02/03/2014 Elsevier Interactive Patient Education  2018 Elsevier Inc.  

## 2018-08-01 ENCOUNTER — Other Ambulatory Visit: Payer: Self-pay | Admitting: Nurse Practitioner

## 2018-08-01 DIAGNOSIS — K219 Gastro-esophageal reflux disease without esophagitis: Secondary | ICD-10-CM

## 2018-09-01 ENCOUNTER — Ambulatory Visit (INDEPENDENT_AMBULATORY_CARE_PROVIDER_SITE_OTHER): Payer: BLUE CROSS/BLUE SHIELD | Admitting: Family Medicine

## 2018-09-01 ENCOUNTER — Telehealth: Payer: Self-pay

## 2018-09-01 ENCOUNTER — Encounter: Payer: Self-pay | Admitting: Family Medicine

## 2018-09-01 VITALS — BP 122/76 | HR 77 | Temp 98.2°F | Resp 16 | Ht 67.0 in | Wt 177.9 lb

## 2018-09-01 DIAGNOSIS — I10 Essential (primary) hypertension: Secondary | ICD-10-CM

## 2018-09-01 DIAGNOSIS — G4726 Circadian rhythm sleep disorder, shift work type: Secondary | ICD-10-CM | POA: Diagnosis not present

## 2018-09-01 DIAGNOSIS — Z23 Encounter for immunization: Secondary | ICD-10-CM

## 2018-09-01 MED ORDER — ARMODAFINIL 150 MG PO TABS: 150.0000 mg | ORAL_TABLET | Freq: Every day | ORAL | 2 refills | Status: DC

## 2018-09-01 NOTE — Patient Instructions (Signed)
Please call us with bp reading once you take Nuvigil to check bp

## 2018-09-01 NOTE — Progress Notes (Signed)
Name: Bryan Green   MRN: [id number]    DOB: Feb 18, 1971   Date:09/01/2018       Progress Note  Subjective  Chief Complaint  Chief Complaint  Patient presents with  . 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 He states he had an energy pill before going to have DOT. He avoids energy drinks but he drinks tea. His schedule changes and he can drive day or night , long hours and takes the energy pills to stay awake. We will try changing to Nuvigil or provigil. BP today is at goal. He denies dizziness, chest pain or palpitation.    Patient Active Problem List   Diagnosis Date Noted  . Elevated LDL cholesterol level 06/02/2018  . GERD (gastroesophageal reflux disease) 04/27/2018  . Hypertension 08/25/2017    Past Surgical History:  Procedure Laterality Date  . UPPER GI ENDOSCOPY Bilateral 2013    Family History  Problem Relation Age of Onset  . Diabetes Mother   . Cancer Father   . Kidney disease Father   . Hypertension Father     Social History   Socioeconomic History  . Marital status: Divorced    Spouse name: Not on file  . Number of children: 3  . Years of education: Not on file  . Highest education level: 12th grade  Occupational History  . Occupation: truck Surveyor, [employer]  . Financial resource strain: Not hard at all  . Food insecurity:    Worry: Never true    Inability: Never true  . Transportation needs:    Medical: No    Non-medical: No  Tobacco Use  . Smoking status: Former Research scientist (life sciences)  . Smokeless tobacco: Never Used  . Tobacco comment: patient stated that he quit 2010  Substance and Sexual Activity  . Alcohol use: No    Alcohol/week: 0.0 standard drinks  . Drug use: No  . Sexual activity: Yes    Partners: Female    Birth control/protection: None  Lifestyle  . Physical activity:    Days per week: 3 days   Minutes per session: 30 min  . Stress: Only a little  Relationships  . Social connections:    Talks on phone: More than three times a week    Gets together: Once a week    Attends religious service: Never    Active member of club or organization: No    Attends meetings of clubs or organizations: Never    Relationship status: Divorced  . Intimate partner violence:    Fear of current or ex partner: No    Emotionally abused: No    Physically abused: No    Forced sexual activity: No  Other Topics Concern  . 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     Current Outpatient Medications:  .  losartan (COZAAR) 25 MG tablet, Take 1 tablet (25 mg total) by mouth daily. 1/2 tablet until BP follow-up., Disp: 90 tablet, Rfl: 1 .  ranitidine (ZANTAC) 150 MG capsule, TAKE 1 CAPSULE BY MOUTH TWICE A DAY, Disp: 180 capsule, Rfl: 1  Allergies  Allergen Reactions  . Lisinopril     Dizziness, headache and drowsy  I personally reviewed active problem list, medication list, allergies, family history, social history with the patient/caregiver today.   ROS  Ten systems reviewed and is negative except as mentioned in HPI   Objective  Vitals:   09/01/18 0741  BP: 122/76  Pulse: 77  Resp: 16  Temp: 98.2 F (36.8 C)  TempSrc: Oral  SpO2: 99%  Weight: 177 lb 14.4 oz (80.7 kg)  Height: 5\' 7"  (1.702 m)    Body mass index is 27.86 kg/m.  Physical Exam  Constitutional: Patient appears well-developed and well-nourished. Muscular  No distress.  HEENT: head atraumatic, normocephalic, pupils equal and reactive to light, neck supple, throat within normal limits Cardiovascular: Normal rate, regular rhythm and normal heart sounds.  No murmur heard. No BLE edema. Pulmonary/Chest: Effort normal and breath sounds normal. No respiratory distress. Abdominal: Soft.  There is no tenderness. Psychiatric: Patient has a normal  mood and affect. behavior is normal. Judgment and thought content normal.  PHQ2/9: Depression screen [facility] 2/9 09/01/2018 06/02/2018 10/22/2017 09/22/2017  Decreased Interest 0 0 0 0  Down, Depressed, Hopeless 0 0 0 0  PHQ - 2 Score 0 0 0 0  Altered sleeping 0 0 - -  Tired, decreased energy 0 0 - -  Change in appetite 0 0 - -  Feeling bad or failure about yourself  0 0 - -  Trouble concentrating 0 0 - -  Moving slowly or fidgety/restless 0 0 - -  Suicidal thoughts 0 0 - -  PHQ-9 Score 0 0 - -  Difficult doing work/chores Not difficult at all Not difficult at all - -    Fall Risk: Fall Risk  09/01/2018 06/02/2018 10/22/2017 09/22/2017  Falls in the past year? No No No No     Assessment & Plan  1. Essential hypertension  Continue medication, bp is at goal, discussed DASH diet  2. Flu vaccine need  - Flu Vaccine QUAD 36+ mos IM  3. Shift work sleep disorder  - Armodafinil 150 MG tablet; Take 1 tablet (150 mg total) by mouth daily. Take prn for long drives to keep you awake  Dispense: 30 tablet; Refill: 2

## 2018-09-01 NOTE — Telephone Encounter (Signed)
Copied from [place] [phone]. Topic: General - Other >> Sep 01, 2018  2:48 PM Carolyn Stare wrote:  Pt said his insurance will not cover Armodafinil 150 MG tablet may pay out of pocket if not to expensive  will check with insurance company to see what they will cover   PA was done online via CoverMyMeds. Waiting for a response.

## 2018-09-01 NOTE — Telephone Encounter (Signed)
Please let me know if too expensive and we will change.

## 2018-09-03 NOTE — Telephone Encounter (Signed)
Put med in cover my med to see if it can be authorized

## 2018-09-23 ENCOUNTER — Other Ambulatory Visit: Payer: Self-pay | Admitting: Nurse Practitioner

## 2018-09-23 DIAGNOSIS — I10 Essential (primary) hypertension: Secondary | ICD-10-CM

## 2018-09-27 ENCOUNTER — Other Ambulatory Visit: Payer: Self-pay | Admitting: Family Medicine

## 2018-09-27 DIAGNOSIS — I10 Essential (primary) hypertension: Secondary | ICD-10-CM

## 2018-10-02 ENCOUNTER — Other Ambulatory Visit: Payer: Self-pay | Admitting: Nurse Practitioner

## 2018-10-02 DIAGNOSIS — I10 Essential (primary) hypertension: Secondary | ICD-10-CM

## 2018-12-14 ENCOUNTER — Ambulatory Visit (INDEPENDENT_AMBULATORY_CARE_PROVIDER_SITE_OTHER): Payer: BLUE CROSS/BLUE SHIELD | Admitting: Family Medicine

## 2018-12-14 ENCOUNTER — Encounter: Payer: Self-pay | Admitting: Family Medicine

## 2018-12-14 VITALS — BP 132/80 | HR 100 | Temp 97.9°F | Resp 16 | Ht 67.0 in | Wt 180.0 lb

## 2018-12-14 DIAGNOSIS — R195 Other fecal abnormalities: Secondary | ICD-10-CM

## 2018-12-14 DIAGNOSIS — E7800 Pure hypercholesterolemia, unspecified: Secondary | ICD-10-CM

## 2018-12-14 DIAGNOSIS — Z0000 Encounter for general adult medical examination without abnormal findings: Secondary | ICD-10-CM | POA: Diagnosis not present

## 2018-12-14 DIAGNOSIS — I10 Essential (primary) hypertension: Secondary | ICD-10-CM

## 2018-12-14 DIAGNOSIS — G4726 Circadian rhythm sleep disorder, shift work type: Secondary | ICD-10-CM

## 2018-12-14 DIAGNOSIS — Z1159 Encounter for screening for other viral diseases: Secondary | ICD-10-CM

## 2018-12-14 DIAGNOSIS — K219 Gastro-esophageal reflux disease without esophagitis: Secondary | ICD-10-CM

## 2018-12-14 MED ORDER — LOSARTAN POTASSIUM 25 MG PO TABS: 25.0000 mg | ORAL_TABLET | Freq: Every day | ORAL | 1 refills | Status: DC

## 2018-12-14 MED ORDER — FAMOTIDINE 20 MG PO TABS: 20.0000 mg | ORAL_TABLET | Freq: Two times a day (BID) | ORAL | 1 refills | Status: DC | PRN

## 2018-12-14 NOTE — Progress Notes (Signed)
Name: Bryan Green   MRN: [id number]    DOB: February 25, 1971   Date:12/14/2018       Progress Note  Subjective  Chief Complaint  Chief Complaint  Patient presents with  . Annual Exam    HPI  Patient presents for annual CPE and follow up.  HTN: He has been taking 25mg  Losartan alone and is feeling well.  Denies chest pain, shortness of breath, headaches, swelling, vision changes. Last CMP on 04/27/2018 was WNL.  BP is at goal today, he has been focusing on avoiding salt, though he still eats salted peanuts while driving sometimes.  Elevated LDL: last check was 04/27/2018; he has been focusing on his diet. LDL was 134.  He denies chest pain, shortness of breath.  He is exercising regularly. Diet is balanced - increasing vegetables, decreased bacon and sausage. Lipid panel - repeat today. No family hx MI <50yo, but does have family history HTN and HLD. The 10-year ASCVD risk score Mikey Bussing DC Brooke Bonito., et al., 2013) is: 8.4%   Values used to calculate the score:     Age: 48 years     Sex: Male     Is Non-Hispanic African American: Yes     Diabetic: No     Tobacco smoker: No     Systolic Blood Pressure: 341 mmHg     Is BP treated: Yes     HDL Cholesterol: 49 mg/dL     Total Cholesterol: 195 mg/dL  GERD: Doing well with dietary changes; taking zantac only as needed.  Denies regurgitation, choking, abdominal pain, vomiting, or stool changes.  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 He notes the current dose is effective, but does keep him up a some when he tries to go to sleep.   USPSTF grade A and B recommendations:  Diet: Balanced Exercise: Tries to work out several days a week  Depression:  Depression screen [facility] 2/9 12/14/2018 09/01/2018 06/02/2018 10/22/2017 09/22/2017  Decreased Interest 0 0 0 0 0  Down, Depressed, Hopeless 0 0 0 0 0  PHQ - 2 Score 0 0 0 0 0  Altered sleeping 0 0 0 - -  Tired, decreased energy 0 0 0 - -   Change in appetite 0 0 0 - -  Feeling bad or failure about yourself  0 0 0 - -  Trouble concentrating 0 0 0 - -  Moving slowly or fidgety/restless 0 0 0 - -  Suicidal thoughts 0 0 0 - -  PHQ-9 Score 0 0 0 - -  Difficult doing work/chores Not difficult at all Not difficult at all Not difficult at all - -    Hypertension:  BP Readings from Last 3 Encounters:  12/14/18 132/80  09/01/18 122/76  06/02/18 118/72    Obesity: Wt Readings from Last 3 Encounters:  12/14/18 180 lb (81.6 kg)  09/01/18 177 lb 14.4 oz (80.7 kg)  06/02/18 184 lb 12.8 oz (83.8 kg)   BMI Readings from Last 3 Encounters:  12/14/18 28.19 kg/m  09/01/18 27.86 kg/m  06/02/18 28.94 kg/m    Lipids:  Lab Results  Component Value Date   CHOL 195 04/27/2018   Lab Results  Component Value Date   HDL 49 04/27/2018   Lab Results  Component Value Date   LDLCALC 134 (H) 04/27/2018   Lab Results  Component Value Date   TRIG 41 04/27/2018   Lab Results  Component Value Date   CHOLHDL 4.0 04/27/2018   No results found for: LDLDIRECT Glucose:  Glucose, Bld  Date Value Ref Range Status  04/27/2018 86 65 - 99 mg/dL Final    Comment:    .            Fasting reference interval .   08/28/2017 88 65 - 139 mg/dL Final    Comment:    .        Non-fasting reference interval .       [facility] Visit from 12/14/2018 in [facility]  AUDIT-C Score  0     Divorced STD testing and prevention (HIV/chl/gon/syphilis): No new partners for 5-6 years. Declines testing. Hep C: We will check today  Skin cancer: No concerning lesions Colorectal cancer: Denies family or personal history of colorectal cancer, no changes in BM's - no blood in stool, dark and tarry stool, mucus in stool, or constipation/diarrhea.  Did discuss increased risk in African Americans and we will re-order cologuard today. Prostate cancer: No personal or family history. Lab Results  Component Value Date   PSA 1.1  04/27/2018    IPSS Questionnaire (AUA-7): Over the past month.   1)  How often have you had a sensation of not emptying your bladder completely after you finish urinating?  3 - About half the time  2)  How often have you had to urinate again less than two hours after you finished urinating? 2 - Less than half the time  3)  How often have you found you stopped and started again several times when you urinated?  0 - Not at all  4) How difficult have you found it to postpone urination?  0 - Not at all  5) How often have you had a weak urinary stream?  0 - Not at all  6) How often have you had to push or strain to begin urination?  0 - Not at all  7) How many times did you most typically get up to urinate from the time you went to bed until the time you got up in the morning?  0 - None  Total score:  0-7 mildly symptomatic   8-19 moderately symptomatic   20-35 severely symptomatic  Score of 5 - had normal PSA in June, we will wait until next follow up to recheck.  He drinks a lot of water and attributes symptoms to this.   Lung cancer:  Low Dose CT Chest recommended if Age 57-80 years, 30 pack-year currently smoking OR have quit w/in 15years. Patient does not qualify.   AAA: N/A The USPSTF recommends one-time screening with ultrasonography in men ages 66 to 71 years who have ever smoked ECG:  No chest pain, shortness of breath, or palpitations.  Advanced Care Planning: A voluntary discussion about advance care planning including the explanation and discussion of advance directives.  Discussed health care proxy and Living will, and the patient was able to identify a health care proxy as Mother (Patty Long).  Patient does not have a living will at present time. If patient does have living will, I have requested they bring this to the clinic to be scanned in to their chart.  Patient Active Problem List   Diagnosis Date Noted  . Elevated LDL cholesterol level 06/02/2018  . GERD (gastroesophageal  reflux disease) 04/27/2018  . Hypertension 08/25/2017    Past Surgical History:  Procedure Laterality Date  . UPPER GI ENDOSCOPY Bilateral 2013  Family History  Problem Relation Age of Onset  . Diabetes Mother   . Cancer Father   . Kidney disease Father   . Hypertension Father     Social History   Socioeconomic History  . Marital status: Divorced    Spouse name: Not on file  . Number of children: 3  . Years of education: Not on file  . Highest education level: 12th grade  Occupational History  . Occupation: truck Surveyor, [employer]  . Financial resource strain: Not hard at all  . Food insecurity:    Worry: Never true    Inability: Never true  . Transportation needs:    Medical: No    Non-medical: No  Tobacco Use  . Smoking status: Former Research scientist (life sciences)  . Smokeless tobacco: Never Used  . Tobacco comment: patient stated that he quit 2010  Substance and Sexual Activity  . Alcohol use: No    Alcohol/week: 0.0 standard drinks  . Drug use: No  . Sexual activity: Yes    Partners: Female    Birth control/protection: None  Lifestyle  . Physical activity:    Days per week: 3 days    Minutes per session: 30 min  . Stress: Only a little  Relationships  . Social connections:    Talks on phone: More than three times a week    Gets together: Once a week    Attends religious service: Never    Active member of club or organization: No    Attends meetings of clubs or organizations: Never    Relationship status: Divorced  . Intimate partner violence:    Fear of current or ex partner: No    Emotionally abused: No    Physically abused: No    Forced sexual activity: No  Other Topics Concern  . 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     Current Outpatient Medications:  .  Armodafinil 150 MG tablet, Take 1 tablet (150 mg total) by mouth daily. Take prn for long drives to keep you awake, Disp: 30  tablet, Rfl: 2 .  losartan (COZAAR) 25 MG tablet, Take 1 tablet (25 mg total) by mouth daily. 1/2 tablet until BP follow-up., Disp: 90 tablet, Rfl: 1 .  ranitidine (ZANTAC) 150 MG capsule, TAKE 1 CAPSULE BY MOUTH TWICE A DAY (Patient not taking: Reported on 12/14/2018), Disp: 180 capsule, Rfl: 1  Allergies  Allergen Reactions  . Lisinopril     Dizziness, headache and drowsy      ROS  Constitutional: Negative for fever or weight change.  Respiratory: Negative for cough and shortness of breath.   Cardiovascular: Negative for chest pain or palpitations.  Gastrointestinal: Negative for abdominal pain, no bowel changes.  Musculoskeletal: Negative for gait problem or joint swelling.  Skin: Negative for rash.  Neurological: Negative for dizziness or headache.  No other specific complaints in a complete review of systems (except as listed in HPI above).  Objective  Vitals:   12/14/18 1013  BP: 132/80  Pulse: 100  Resp: 16  Temp: 97.9 F (36.6 C)  TempSrc: Oral  SpO2: 96%  Weight: 180 lb (81.6 kg)  Height: 5\' 7"  (1.702 m)    Body mass index is 28.19 kg/m.  Physical Exam  Constitutional: Patient appears well-developed and well-nourished. No distress.  HENT: Head: Normocephalic and atraumatic. Ears: B TMs ok, no erythema or effusion; Nose:  Nose normal. Mouth/Throat: Oropharynx is clear and moist. No oropharyngeal exudate.  Eyes: Conjunctivae and EOM are normal. Pupils are equal, round, and reactive to light. No scleral icterus.  Neck: Normal range of motion. Neck supple. No JVD present. No thyromegaly present.  Cardiovascular: Normal rate, regular rhythm and normal heart sounds.  No murmur heard. No BLE edema. Pulmonary/Chest: Effort normal and breath sounds normal. No respiratory distress. Abdominal: Soft. Bowel sounds are normal, no distension. There is no tenderness. no masses MALE GENITALIA: RECTAL: Prostate normal size and consistency, no rectal masses or  hemorrhoids Musculoskeletal: Normal range of motion, no joint effusions. No gross deformities Neurological: he is alert and oriented to person, place, and time. No cranial nerve deficit. Coordination, balance, strength, speech and gait are normal.  Skin: Skin is warm and dry. No rash noted. No erythema.  Psychiatric: Patient has a normal mood and affect. behavior is normal. Judgment and thought content normal.  No results found for this or any previous visit (from the past 2160 hour(s)).  PHQ2/9: Depression screen [facility] 2/9 12/14/2018 09/01/2018 06/02/2018 10/22/2017 09/22/2017  Decreased Interest 0 0 0 0 0  Down, Depressed, Hopeless 0 0 0 0 0  PHQ - 2 Score 0 0 0 0 0  Altered sleeping 0 0 0 - -  Tired, decreased energy 0 0 0 - -  Change in appetite 0 0 0 - -  Feeling bad or failure about yourself  0 0 0 - -  Trouble concentrating 0 0 0 - -  Moving slowly or fidgety/restless 0 0 0 - -  Suicidal thoughts 0 0 0 - -  PHQ-9 Score 0 0 0 - -  Difficult doing work/chores Not difficult at all Not difficult at all Not difficult at all - -   Fall Risk: Fall Risk  12/14/2018 09/01/2018 06/02/2018 10/22/2017 09/22/2017  Falls in the past year? 0 No No No No  Number falls in past yr: 0 - - - -  Injury with Fall? 0 - - - -  Follow up Falls evaluation completed - - - -   Assessment & Plan  1. Annual physical exam -Prostate cancer screening and PSA options (with potential risks and benefits of testing vs not testing) were discussed along with recent recs/guidelines. -USPSTF grade A and B recommendations reviewed with patient; age-appropriate recommendations, preventive care, screening tests, etc discussed and encouraged; healthy living encouraged; see AVS for patient education given to patient -Discussed importance of 150 minutes of physical activity weekly, eat two servings of fish weekly, eat one serving of tree nuts ( cashews, pistachios, pecans, almonds.Marland Kitchen) every other day, eat 6 servings of  fruit/vegetables daily and drink plenty of water and avoid sweet beverages. 2. Essential hypertension - losartan (COZAAR) 25 MG tablet; Take 1 tablet (25 mg total) by mouth daily. 1/2 tablet until BP follow-up.  Dispense: 90 tablet; Refill: 1 - COMPLETE METABOLIC PANEL WITH GFR  3. Gastroesophageal reflux disease without esophagitis - famotidine (PEPCID) 20 MG tablet; Take 1 tablet (20 mg total) by mouth 2 (two) times daily as needed for heartburn or indigestion.  Dispense: 90 tablet; Refill: 1  4. Elevated LDL cholesterol level - Lipid panel  5. Need for hepatitis C screening test - Hepatitis C antibody  6. 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

## 2018-12-14 NOTE — Patient Instructions (Signed)
Preventive Care 40-64 Years, Male Preventive care refers to lifestyle choices and visits with your health care provider that can promote health and wellness. What does preventive care include?   A yearly physical exam. This is also called an annual well check.  Dental exams once or twice a year.  Routine eye exams. Ask your health care provider how often you should have your eyes checked.  Personal lifestyle choices, including: ? Daily care of your teeth and gums. ? Regular physical activity. ? Eating a healthy diet. ? Avoiding tobacco and drug use. ? Limiting alcohol use. ? Practicing safe sex. ? Taking low-dose aspirin every day starting at age 50. What happens during an annual well check? The services and screenings done by your health care provider during your annual well check will depend on your age, overall health, lifestyle risk factors, and family history of disease. Counseling Your health care provider may ask you questions about your:  Alcohol use.  Tobacco use.  Drug use.  Emotional well-being.  Home and relationship well-being.  Sexual activity.  Eating habits.  Work and work environment. Screening You may have the following tests or measurements:  Height, weight, and BMI.  Blood pressure.  Lipid and cholesterol levels. These may be checked every 5 years, or more frequently if you are over 50 years old.  Skin check.  Lung cancer screening. You may have this screening every year starting at age 55 if you have a 30-pack-year history of smoking and currently smoke or have quit within the past 15 years.  Colorectal cancer screening. All adults should have this screening starting at age 50 and continuing until age 75. Your health care provider may recommend screening at age 45. You will have tests every 1-10 years, depending on your results and the type of screening test. People at increased risk should start screening at an earlier age. Screening tests may  include: ? Guaiac-based fecal occult blood testing. ? Fecal immunochemical test (FIT). ? Stool DNA test. ? Virtual colonoscopy. ? Sigmoidoscopy. During this test, a flexible tube with a tiny camera (sigmoidoscope) is used to examine your rectum and lower colon. The sigmoidoscope is inserted through your anus into your rectum and lower colon. ? Colonoscopy. During this test, a long, thin, flexible tube with a tiny camera (colonoscope) is used to examine your entire colon and rectum.  Prostate cancer screening. Recommendations will vary depending on your family history and other risks.  Hepatitis C blood test.  Hepatitis B blood test.  Sexually transmitted disease (STD) testing.  Diabetes screening. This is done by checking your blood sugar (glucose) after you have not eaten for a while (fasting). You may have this done every 1-3 years. Discuss your test results, treatment options, and if necessary, the need for more tests with your health care provider. Vaccines Your health care provider may recommend certain vaccines, such as:  Influenza vaccine. This is recommended every year.  Tetanus, diphtheria, and acellular pertussis (Tdap, Td) vaccine. You may need a Td booster every 10 years.  Varicella vaccine. You may need this if you have not been vaccinated.  Zoster vaccine. You may need this after age 60.  Measles, mumps, and rubella (MMR) vaccine. You may need at least one dose of MMR if you were born in 1957 or later. You may also need a second dose.  Pneumococcal 13-valent conjugate (PCV13) vaccine. You may need this if you have certain conditions and have not been vaccinated.  Pneumococcal polysaccharide (PPSV23) vaccine.   and rubella (MMR) vaccine. You may need at least one dose of MMR if you were born in 1957 or later. You may also need a second dose.   Pneumococcal 13-valent conjugate (PCV13) vaccine. You may need this if you have certain conditions and have not been vaccinated.   Pneumococcal polysaccharide (PPSV23) vaccine. You may need one or two doses if you smoke cigarettes or if you have certain conditions.   Meningococcal vaccine. You may need this if you have certain conditions.   Hepatitis A vaccine. You may need this if you have certain conditions or if you travel or work in  places where you may be exposed to hepatitis A.   Hepatitis B vaccine. You may need this if you have certain conditions or if you travel or work in places where you may be exposed to hepatitis B.   Haemophilus influenzae type b (Hib) vaccine. You may need this if you have certain risk factors.  Talk to your health care provider about which screenings and vaccines you need and how often you need them.  This information is not intended to replace advice given to you by your health care provider. Make sure you discuss any questions you have with your health care provider.  Document Released: 12/01/2015 Document Revised: 12/25/2017 Document Reviewed: 09/05/2015  Elsevier Interactive Patient Education  2019 [employer]

## 2018-12-15 ENCOUNTER — Other Ambulatory Visit: Payer: Self-pay | Admitting: Family Medicine

## 2018-12-15 DIAGNOSIS — E7800 Pure hypercholesterolemia, unspecified: Secondary | ICD-10-CM

## 2018-12-15 LAB — COMPLETE METABOLIC PANEL WITH GFR
AG Ratio: 1.8 (calc) (ref 1.0–2.5)
ALBUMIN MSPROF: 4.8 g/dL (ref 3.6–5.1)
ALKALINE PHOSPHATASE (APISO): 77 U/L (ref 40–115)
ALT: 21 U/L (ref 9–46)
AST: 25 U/L (ref 10–40)
BUN: 15 mg/dL (ref 7–25)
CO2: 29 mmol/L (ref 20–32)
Calcium: 10.2 mg/dL (ref 8.6–10.3)
Chloride: 102 mmol/L (ref 98–110)
Creat: 1.1 mg/dL (ref 0.60–1.35)
GFR, Est African American: 92 mL/min/{1.73_m2} (ref >=60)
GFR, Est Non African American: 79 mL/min/{1.73_m2} (ref >=60)
Globulin: 2.6 g/dL (calc) (ref 1.9–3.7)
Glucose, Bld: 80 mg/dL (ref 65–99)
Potassium: 4.6 mmol/L (ref 3.5–5.3)
Sodium: 140 mmol/L (ref 135–146)
Total Bilirubin: 0.8 mg/dL (ref 0.2–1.2)
Total Protein: 7.4 g/dL (ref 6.1–8.1)

## 2018-12-15 LAB — LIPID PANEL
Cholesterol: 190 mg/dL (ref <200)
HDL: 49 mg/dL (ref >40)
LDL Cholesterol (Calc): 129 mg/dL (calc) — ABNORMAL HIGH
Non-HDL Cholesterol (Calc): 141 mg/dL (calc) — ABNORMAL HIGH (ref <130)
Total CHOL/HDL Ratio: 3.9 (calc) (ref <5.0)
Triglycerides: 39 mg/dL (ref <150)

## 2018-12-15 LAB — HEPATITIS C ANTIBODY
HEP C AB: NONREACTIVE
SIGNAL TO CUT-OFF: 0.02 (ref <1.00)

## 2018-12-15 MED ORDER — ATORVASTATIN CALCIUM 20 MG PO TABS: 20.0000 mg | ORAL_TABLET | Freq: Every day | ORAL | 3 refills | Status: DC

## 2019-04-28 ENCOUNTER — Other Ambulatory Visit: Payer: Self-pay | Admitting: Family Medicine

## 2019-04-28 DIAGNOSIS — I10 Essential (primary) hypertension: Secondary | ICD-10-CM

## 2019-06-14 ENCOUNTER — Ambulatory Visit: Payer: BLUE CROSS/BLUE SHIELD | Admitting: Family Medicine

## 2019-06-17 ENCOUNTER — Ambulatory Visit: Payer: Self-pay | Admitting: Family Medicine

## 2019-06-21 ENCOUNTER — Other Ambulatory Visit: Payer: Self-pay

## 2019-06-21 ENCOUNTER — Encounter: Payer: Self-pay | Admitting: Family Medicine

## 2019-06-21 ENCOUNTER — Ambulatory Visit: Payer: Self-pay | Admitting: Family Medicine

## 2019-06-21 VITALS — BP 128/82 | HR 67 | Temp 97.8°F | Resp 16 | Ht 67.0 in | Wt 171.5 lb

## 2019-06-21 DIAGNOSIS — G4726 Circadian rhythm sleep disorder, shift work type: Secondary | ICD-10-CM

## 2019-06-21 DIAGNOSIS — E7800 Pure hypercholesterolemia, unspecified: Secondary | ICD-10-CM

## 2019-06-21 DIAGNOSIS — K219 Gastro-esophageal reflux disease without esophagitis: Secondary | ICD-10-CM

## 2019-06-21 DIAGNOSIS — I10 Essential (primary) hypertension: Secondary | ICD-10-CM

## 2019-06-21 LAB — LIPID PANEL
Cholesterol: 206 mg/dL — ABNORMAL HIGH (ref <200)
HDL: 61 mg/dL (ref >=40)
LDL Cholesterol (Calc): 130 mg/dL (calc) — ABNORMAL HIGH
Non-HDL Cholesterol (Calc): 145 mg/dL (calc) — ABNORMAL HIGH (ref <130)
Total CHOL/HDL Ratio: 3.4 (calc) (ref <5.0)
Triglycerides: 60 mg/dL (ref <150)

## 2019-06-21 NOTE — Progress Notes (Signed)
Name: Bryan Green   MRN: [id number]    DOB: [birth year]   Date:06/21/2019       Progress Note  Subjective  Chief Complaint  Chief Complaint  Patient presents with  . Hyperlipidemia    follow up, Has not taken any medication in 2 weeks due to know insurance  . Hypertension    HPI   HTN: He had been taking 25mg  Losartan and was feeling well, but quit his job recently and is under less stress, eating a lot better, and he ran out of medication over 2 weeks ago.  BP is at goal without the medication, so we will leave him off of it for now.   Denies chest pain, shortness of breath, headaches, swelling, vision changes. Last CMP on 04/27/2018 was WNL.   Elevated LDL: last check was 12/14/2018; he has been focusing on his diet. LDL was 134. He denies chest pain, shortness of breath.  He is exercising regularly. Diet is improved significantly - increasing vegetables, decreased bacon and sausage. Lipid panel - repeat today. No family hx MI <50yo, but does have family history HTN and HLD.  The 10-year ASCVD risk score Mikey Bussing DC Brooke Bonito., et al., 2013) is: 5%   Values used to calculate the score:     Age: 48 years     Sex: Male     Is Non-Hispanic African American: Yes     Diabetic: No     Tobacco smoker: No     Systolic Blood Pressure: 789 mmHg     Is BP treated: No     HDL Cholesterol: 49 mg/dL     Total Cholesterol: 190 mg/dL  GERD: Doing well with dietary changes; has not needed pepcid in a few months. Denies regurgitation, choking, abdominal pain, vomiting, or stool changes. Stable and unchanged.  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 He notes the current dose is effective, but does keep him up a some when he tries to go to sleep.  He has not needed it since he stopped working night shift, so we will d/c for now; if he returns to night shift, we will provide for him again.   Patient Active Problem List   Diagnosis  Date Noted  . Hyperlipidemia 06/02/2018  . GERD (gastroesophageal reflux disease) 04/27/2018  . Hypertension 08/25/2017    Past Surgical History:  Procedure Laterality Date  . UPPER GI ENDOSCOPY Bilateral 2013    Family History  Problem Relation Age of Onset  . Diabetes Mother   . Cancer Father        Stomach  . Kidney disease Father   . Hypertension Father     Social History   Socioeconomic History  . Marital status: Divorced    Spouse name: Not on file  . Number of children: 3  . Years of education: Not on file  . Highest education level: 12th grade  Occupational History  . Occupation: truck Surveyor, [employer]  . Financial resource strain: Not hard at all  . Food insecurity    Worry: Never true    Inability: Never true  . Transportation needs    Medical: No    Non-medical: No  Tobacco Use  . Smoking status: Former Smoker    Packs/day: 0.75    Years: 22.00    Pack years: 16.50  . Smokeless tobacco: Never Used  . Tobacco comment: patient stated  that he quit 2010  Substance and Sexual Activity  . Alcohol use: No    Alcohol/week: 0.0 standard drinks  . Drug use: No  . Sexual activity: Yes    Partners: Female    Birth control/protection: None  Lifestyle  . Physical activity    Days per week: 3 days    Minutes per session: 30 min  . Stress: Only a little  Relationships  . Social connections    Talks on phone: More than three times a week    Gets together: Once a week    Attends religious service: Never    Active member of club or organization: No    Attends meetings of clubs or organizations: Never    Relationship status: Divorced  . Intimate partner violence    Fear of current or ex partner: No    Emotionally abused: No    Physically abused: No    Forced sexual activity: No  Other Topics Concern  . 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     Current Outpatient  Medications:  .  Armodafinil 150 MG tablet, Take 1 tablet (150 mg total) by mouth daily. Take prn for long drives to keep you awake (Patient not taking: Reported on 06/21/2019), Disp: 30 tablet, Rfl: 2 .  atorvastatin (LIPITOR) 20 MG tablet, Take 1 tablet (20 mg total) by mouth daily. (Patient not taking: Reported on 06/21/2019), Disp: 90 tablet, Rfl: 3 .  famotidine (PEPCID) 20 MG tablet, Take 1 tablet (20 mg total) by mouth 2 (two) times daily as needed for heartburn or indigestion. (Patient not taking: Reported on 06/21/2019), Disp: 90 tablet, Rfl: 1 .  losartan (COZAAR) 25 MG tablet, TAKE 1 TABLET (25 MG TOTAL) BY MOUTH DAILY. 1/2 TABLET UNTIL BP FOLLOW-UP. (Patient not taking: Reported on 06/21/2019), Disp: 90 tablet, Rfl: 0  Allergies  Allergen Reactions  . Lisinopril     Dizziness, headache and drowsy     I personally reviewed active problem list, medication list, allergies, notes from last encounter, lab results with the patient/caregiver today.   ROS  Ten systems reviewed and is negative except as mentioned in HPI  Objective  Vitals:   06/21/19 0751  BP: 128/82  Pulse: 67  Resp: 16  Temp: 97.8 F (36.6 C)  SpO2: 94%  Weight: 171 lb 8 oz (77.8 kg)  Height: 5\' 7"  (1.702 m)    Body mass index is 26.86 kg/m.  Physical Exam Constitutional: Patient appears well-developed and well-nourished. No distress.  HENT: Head: Normocephalic and atraumatic. Eyes: Conjunctivae and EOM are normal. No scleral icterus.   Neck: Normal range of motion. Neck supple. No JVD present. No thyromegaly present.  Cardiovascular: Normal rate, regular rhythm and normal heart sounds.  No murmur heard. No BLE edema. Pulmonary/Chest: Effort normal and breath sounds normal. No respiratory distress. Musculoskeletal: Normal range of motion, no joint effusions. No gross deformities Neurological: Pt is alert and oriented to person, place, and time. No cranial nerve deficit. Coordination, balance, strength, speech  and gait are normal.  Skin: Skin is warm and dry. No rash noted. No erythema.  Psychiatric: Patient has a normal mood and affect. behavior is normal. Judgment and thought content normal.  No results found for this or any previous visit (from the past 72 hour(s)).  PHQ2/9: Depression screen [facility] 2/9 06/21/2019 12/14/2018 09/01/2018 06/02/2018 10/22/2017  Decreased Interest 0 0 0  0 0  Down, Depressed, Hopeless 0 0 0 0 0  PHQ - 2 Score 0 0 0 0 0  Altered sleeping 0 0 0 0 -  Tired, decreased energy 0 0 0 0 -  Change in appetite 0 0 0 0 -  Feeling bad or failure about yourself  0 0 0 0 -  Trouble concentrating 0 0 0 0 -  Moving slowly or fidgety/restless 0 0 0 0 -  Suicidal thoughts 0 0 0 0 -  PHQ-9 Score 0 0 0 0 -  Difficult doing work/chores Not difficult at all Not difficult at all Not difficult at all Not difficult at all -   PHQ-2/9 Result is negative.    Fall Risk: Fall Risk  06/21/2019 12/14/2018 09/01/2018 06/02/2018 10/22/2017  Falls in the past year? 0 0 No No No  Number falls in past yr: 0 0 - - -  Injury with Fall? 0 0 - - -  Follow up Falls evaluation completed Falls evaluation completed - - -   Assessment & Plan  1. Pure hypercholesterolemia - Lipid panel  2. Essential hypertension - Stop medication at this time - BP under control without medication for about 2 weeks and with lifestyle changes.  3. 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

## 2019-06-21 NOTE — Patient Instructions (Addendum)
[employer] - For prescription coupons  Fat and Cholesterol Restricted Eating Plan Getting too much fat and cholesterol in your diet may cause health problems. Choosing the right foods helps keep your fat and cholesterol at normal levels. This can keep you from getting certain diseases. Your doctor may recommend an eating plan that includes:  Total fat: ______% or less of total calories a day.  Saturated fat: ______% or less of total calories a day.  Cholesterol: less than _________mg a day.  Fiber: ______g a day. What are tips for following this plan? Meal planning  At meals, divide your plate into four equal parts: ? Fill one-half of your plate with vegetables and green salads. ? Fill one-fourth of your plate with whole grains. ? Fill one-fourth of your plate with low-fat (lean) protein foods.  Eat fish that is high in omega-3 fats at least two times a week. This includes mackerel, tuna, sardines, and salmon.  Eat foods that are high in fiber, such as whole grains, beans, apples, broccoli, carrots, peas, and barley. General tips   Work with your doctor to lose weight if you need to.  Avoid: ? Foods with added sugar. ? Fried foods. ? Foods with partially hydrogenated oils.  Limit alcohol intake to no more than 1 drink a day for nonpregnant women and 2 drinks a day for men. One drink equals 12 oz of beer, 5 oz of wine, or 1 oz of hard liquor. Reading food labels  Check food labels for: ? Trans fats. ? Partially hydrogenated oils. ? Saturated fat (g) in each serving. ? Cholesterol (mg) in each serving. ? Fiber (g) in each serving.  Choose foods with healthy fats, such as: ? Monounsaturated fats. ? Polyunsaturated fats. ? Omega-3 fats.  Choose grain products that have whole grains. Look for the word "whole" as the first word in the ingredient list. Cooking  Cook foods using low-fat methods. These include baking, boiling, grilling, and broiling.  Eat more  home-cooked foods. Eat at restaurants and buffets less often.  Avoid cooking using saturated fats, such as butter, cream, palm oil, palm kernel oil, and coconut oil. Recommended foods  Fruits  All fresh, canned (in natural juice), or frozen fruits. Vegetables  Fresh or frozen vegetables (raw, steamed, roasted, or grilled). Green salads. Grains  Whole grains, such as whole wheat or whole grain breads, crackers, cereals, and pasta. Unsweetened oatmeal, bulgur, barley, quinoa, or brown rice. Corn or whole wheat flour tortillas. Meats and other protein foods  Ground beef (85% or leaner), grass-fed beef, or beef trimmed of fat. Skinless chicken or [place]. Ground chicken or [place]. Pork trimmed of fat. All fish and seafood. Egg whites. Dried beans, peas, or lentils. Unsalted nuts or seeds. Unsalted canned beans. Nut butters without added sugar or oil. Dairy  Low-fat or nonfat dairy products, such as skim or 1% milk, 2% or reduced-fat cheeses, low-fat and fat-free ricotta or cottage cheese, or plain low-fat and nonfat yogurt. Fats and oils  Tub margarine without trans fats. Light or reduced-fat mayonnaise and salad dressings. Avocado. Olive, canola, sesame, or safflower oils. The items listed above may not be a complete list of foods and beverages you can eat. Contact a dietitian for more information. Foods to avoid Fruits  Canned fruit in heavy syrup. Fruit in cream or butter sauce. Fried fruit. Vegetables  Vegetables cooked in cheese, cream, or butter sauce. Fried vegetables. Grains  White bread. White pasta. White rice. Cornbread. Bagels, pastries, and croissants. Crackers and  snack foods that contain trans fat and hydrogenated oils. Meats and other protein foods  Fatty cuts of meat. Ribs, chicken wings, bacon, sausage, bologna, salami, chitterlings, fatback, hot dogs, bratwurst, and packaged lunch meats. Liver and organ meats. Whole eggs and egg yolks. Chicken and [place] with skin.  Fried meat. Dairy  Whole or 2% milk, cream, half-and-half, and cream cheese. Whole milk cheeses. Whole-fat or sweetened yogurt. Full-fat cheeses. Nondairy creamers and whipped toppings. Processed cheese, cheese spreads, and cheese curds. Beverages  Alcohol. Sugar-sweetened drinks such as sodas, lemonade, and fruit drinks. Fats and oils  Butter, stick margarine, lard, shortening, ghee, or bacon fat. Coconut, palm kernel, and palm oils. Sweets and desserts  Corn syrup, sugars, honey, and molasses. Candy. Jam and jelly. Syrup. Sweetened cereals. Cookies, pies, cakes, donuts, muffins, and ice cream. The items listed above may not be a complete list of foods and beverages you should avoid. Contact a dietitian for more information. Summary  Choosing the right foods helps keep your fat and cholesterol at normal levels. This can keep you from getting certain diseases.  At meals, fill one-half of your plate with vegetables and green salads.  Eat high-fiber foods, like whole grains, beans, apples, carrots, peas, and barley.  Limit added sugar, saturated fats, alcohol, and fried foods. This information is not intended to replace advice given to you by your health care provider. Make sure you discuss any questions you have with your health care provider. Document Released: 05/05/2012 Document Revised: 07/08/2018 Document Reviewed: 07/22/2017 Elsevier Patient Education  2020 [employer].

## 2019-07-30 ENCOUNTER — Other Ambulatory Visit: Payer: Self-pay

## 2019-07-30 ENCOUNTER — Emergency Department
Admission: EM | Admit: 2019-07-30 | Discharge: 2019-07-30 | Disposition: A | Discharge status: Home / Self Care | Payer: No Typology Code available for payment source | Attending: Emergency Medicine | Admitting: Emergency Medicine

## 2019-07-30 ENCOUNTER — Emergency Department: Payer: No Typology Code available for payment source

## 2019-07-30 DIAGNOSIS — K219 Gastro-esophageal reflux disease without esophagitis: Secondary | ICD-10-CM | POA: Insufficient documentation

## 2019-07-30 DIAGNOSIS — R002 Palpitations: Secondary | ICD-10-CM

## 2019-07-30 DIAGNOSIS — Z79899 Other long term (current) drug therapy: Secondary | ICD-10-CM | POA: Insufficient documentation

## 2019-07-30 DIAGNOSIS — I10 Essential (primary) hypertension: Secondary | ICD-10-CM | POA: Insufficient documentation

## 2019-07-30 DIAGNOSIS — Z87891 Personal history of nicotine dependence: Secondary | ICD-10-CM | POA: Insufficient documentation

## 2019-07-30 LAB — CBC
HCT: 45 % (ref 39.0–52.0)
Hemoglobin: 14.4 g/dL (ref 13.0–17.0)
MCH: 27.1 pg (ref 26.0–34.0)
MCHC: 32 g/dL (ref 30.0–36.0)
MCV: 84.7 fL (ref 80.0–100.0)
Platelets: 244 10*3/uL (ref 150–400)
RBC: 5.31 MIL/uL (ref 4.22–5.81)
RDW: 12.8 % (ref 11.5–15.5)
WBC: 5.2 10*3/uL (ref 4.0–10.5)
nRBC: 0 % (ref 0.0–0.2)

## 2019-07-30 LAB — COMPREHENSIVE METABOLIC PANEL
ALT: 28 U/L (ref 0–44)
AST: 35 U/L (ref 15–41)
Albumin: 4.6 g/dL (ref 3.5–5.0)
Alkaline Phosphatase: 73 U/L (ref 38–126)
Anion gap: 9 (ref 5–15)
BUN: 13 mg/dL (ref 6–20)
CO2: 27 mmol/L (ref 22–32)
Calcium: 9.6 mg/dL (ref 8.9–10.3)
Chloride: 103 mmol/L (ref 98–111)
Creatinine, Ser: 1.14 mg/dL (ref 0.61–1.24)
GFR calc Af Amer: >60 mL/min (ref >60)
GFR calc non Af Amer: >60 mL/min (ref >60)
Glucose, Bld: 96 mg/dL (ref 70–99)
Potassium: 4.1 mmol/L (ref 3.5–5.1)
Sodium: 139 mmol/L (ref 135–145)
Total Bilirubin: 0.9 mg/dL (ref 0.3–1.2)
Total Protein: 7.6 g/dL (ref 6.5–8.1)

## 2019-07-30 LAB — TROPONIN I (HIGH SENSITIVITY)
Troponin I (High Sensitivity): 12 ng/L (ref <18)
Troponin I (High Sensitivity): 12 ng/L (ref <18)

## 2019-07-30 LAB — LIPASE, BLOOD: Lipase: 45 U/L (ref 11–51)

## 2019-07-30 MED ORDER — PANTOPRAZOLE SODIUM 40 MG PO TBEC: 40.0000 mg | DELAYED_RELEASE_TABLET | Freq: Every day | ORAL | 1 refills | Status: DC

## 2019-07-30 MED ORDER — LIDOCAINE VISCOUS HCL 2 % MT SOLN
15.0000 mL | Freq: Once | OROMUCOSAL | Status: AC
  Administered 2019-07-30: 10:00:00 15 mL via ORAL
  Filled 2019-07-30: qty 15

## 2019-07-30 MED ORDER — ALUM & MAG HYDROXIDE-SIMETH 200-200-20 MG/5ML PO SUSP
30.0000 mL | Freq: Once | ORAL | Status: AC
  Administered 2019-07-30: 10:00:00 30 mL via ORAL
  Filled 2019-07-30: qty 30

## 2019-07-30 NOTE — ED Triage Notes (Signed)
Centralized chest and epigastric pain that started yesterday, reports heart palpitations as well. Pt alert and oriented X4, cooperative, RR even and unlabored, color WNL. Pt in NAD. Hx of GERD

## 2019-07-30 NOTE — ED Notes (Signed)
Pt st he was dx with GERD and heart palpitations years ago. Pt st ate some cereal and a banana late last night and "it feels like its still stuck there". Pt st that this hasn't happened to him in a long time. Pt st he "felt like he couldn't catch is breath and couldn't do anything extraneous". Pt st that during this moments he "tries to stay calm and drink water and it helps a little but doesn't make it go away".

## 2019-07-30 NOTE — ED Provider Notes (Signed)
[facility] Emergency Department Provider Note  Time seen: 9:35 AM  I have reviewed the triage vital signs and the nursing notes.   HISTORY  Chief Complaint Abdominal Pain and Chest Pain   HPI Bryan Green is a 48 y.o. male with a past medical history of hypertension, gastric reflux, presents to the emergency department for upper abdominal discomfort and palpitations.  According to the patient he stopped taking his gastric reflux medication quite a while ago.  States over the past week or so he has been experiencing discomfort in the upper abdomen consistent with past reflux.  Denies any vomiting or diarrhea.  Denies any pain in the chest but states he has been feeling palpitations at times, denies any currently.  States minimal shortness of breath when he is experiencing palpitations denies any currently.  Denies any fever cough.  Past Medical History:  Diagnosis Date  . Depression    pt. does not recall ever being diagnosed with depression, neither being on medications.   . Hypertension     Patient Active Problem List   Diagnosis Date Noted  . Hyperlipidemia 06/02/2018  . GERD (gastroesophageal reflux disease) 04/27/2018  . Hypertension 08/25/2017    Past Surgical History:  Procedure Laterality Date  . UPPER GI ENDOSCOPY Bilateral 2013    Prior to Admission medications   Medication Sig Start Date End Date Taking? Authorizing Provider  atorvastatin (LIPITOR) 20 MG tablet Take 1 tablet (20 mg total) by mouth daily. 12/15/18   Hubbard Hartshorn, FNP    Allergies  Allergen Reactions  . Lisinopril     Dizziness, headache and drowsy     Family History  Problem Relation Age of Onset  . Diabetes Mother   . Cancer Father        Stomach  . Kidney disease Father   . Hypertension Father     Social History Social History   Tobacco Use  . Smoking status: Former Smoker    Packs/day: 0.75    Years: 22.00    Pack years: 16.50  . Smokeless tobacco:  Never Used  . Tobacco comment: patient stated that he quit 2010  Substance Use Topics  . Alcohol use: No    Alcohol/week: 0.0 standard drinks  . Drug use: No    Review of Systems Constitutional: Negative for fever. ENT: Negative for recent illness/congestion Cardiovascular: Negative for chest pain.  Positive for intermittent palpitations. Respiratory: Mild shortness of breath during palpitations. Gastrointestinal: Slight epigastric discomfort which he describes more like fullness.  Negative for nausea vomiting or diarrhea. Musculoskeletal: Negative for musculoskeletal complaints Skin: Negative for skin complaints  Neurological: Negative for headache All other ROS negative  ____________________________________________   PHYSICAL EXAM:  VITAL SIGNS: ED Triage Vitals [07/30/19 0905]  Enc Vitals Group     BP (!) 155/95     Pulse Rate (!) 53     Resp 16     Temp 98 F (36.7 C)     Temp Source Oral     SpO2 100 %     Weight 170 lb (77.1 kg)     Height 5\' 7"  (1.702 m)     Head Circumference      Peak Flow      Pain Score 7     Pain Loc      Pain Edu?      Excl. in [place]?     Constitutional: Alert and oriented. Well appearing and in no distress. Eyes: Normal exam ENT  Head: Normocephalic and atraumatic.      Mouth/Throat: Mucous membranes are moist. Cardiovascular: Normal rate, regular rhythm.  Respiratory: Normal respiratory effort without tachypnea nor retractions. Breath sounds are clear  Gastrointestinal: Soft, benign abdominal exam without any tenderness to palpation rebound guarding or distention. Musculoskeletal: Nontender with normal range of motion in all extremities. Neurologic:  Normal speech and language. No gross focal neurologic deficits Skin:  Skin is warm, dry and intact.  Psychiatric: Mood and affect are normal  ____________________________________________    EKG  EKG viewed and interpreted by myself shows sinus bradycardia 57 bpm with a narrow  QRS, normal axis, normal intervals, nonspecific ST changes.  ____________________________________________    RADIOLOGY  X-rays negative  ____________________________________________   INITIAL IMPRESSION / ASSESSMENT AND PLAN / ED COURSE  Pertinent labs & imaging results that were available during my care of the patient were reviewed by me and considered in my medical decision making (see chart for details).   Patient presents emergency department for upper abdominal fullness, intermittent palpitations.  Overall the patient appears well, no distress.  Differential would include gastric reflux, gastric or peptic ulcer disease, other intra-abdominal pathologies such as pancreatitis.  We will check labs, EKG, chest x-ray and treat with a GI cocktail and closely monitor.  Patient agreeable to plan of care.  Patient's work-up is reassuring.  Lab work including troponin is largely within normal limits.  X-ray is negative.  Patient is feeling better after GI cocktail.  We will place the patient back on Protonix I also recommended using liquid Maalox after each meal and before bed for the next 3 to 5 days.  Patient is to follow-up with his GI doctor.  Patient agreeable to plan of care.   STEPHONE MCCLARNON was evaluated in Emergency Department on 07/30/2019 for the symptoms described in the history of present illness. He was evaluated in the context of the global COVID-19 pandemic, which necessitated consideration that the patient might be at risk for infection with the SARS-CoV-2 virus that causes COVID-19. Institutional protocols and algorithms that pertain to the evaluation of patients at risk for COVID-19 are in a state of rapid change based on information released by regulatory bodies including the CDC and federal and state organizations. These policies and algorithms were followed during the patient's care in the ED.  ____________________________________________   FINAL CLINICAL IMPRESSION(S) / ED  DIAGNOSES  Palpitations Reflux   Harvest Dark, MD 07/30/19 1204

## 2019-11-26 ENCOUNTER — Encounter: Payer: Self-pay | Admitting: Family Medicine

## 2019-12-10 ENCOUNTER — Other Ambulatory Visit: Payer: Self-pay

## 2019-12-10 ENCOUNTER — Encounter: Payer: Self-pay | Admitting: Family Medicine

## 2019-12-10 ENCOUNTER — Ambulatory Visit: Payer: Self-pay | Admitting: Family Medicine

## 2019-12-10 VITALS — BP 128/84 | HR 66 | Temp 96.9°F | Resp 16 | Ht 67.0 in | Wt 183.6 lb

## 2019-12-10 DIAGNOSIS — Z23 Encounter for immunization: Secondary | ICD-10-CM

## 2019-12-10 DIAGNOSIS — I10 Essential (primary) hypertension: Secondary | ICD-10-CM

## 2019-12-10 DIAGNOSIS — E7800 Pure hypercholesterolemia, unspecified: Secondary | ICD-10-CM

## 2019-12-10 DIAGNOSIS — K219 Gastro-esophageal reflux disease without esophagitis: Secondary | ICD-10-CM

## 2019-12-10 MED ORDER — ATORVASTATIN CALCIUM 20 MG PO TABS: 20.0000 mg | ORAL_TABLET | Freq: Every day | ORAL | 1 refills | Status: DC

## 2019-12-10 NOTE — Progress Notes (Signed)
Name: Bryan Green   MRN: [id number]    DOB: 07/21/1971   Date:12/10/2019       Progress Note  Chief Complaint  Patient presents with  . Annual Exam  . Hyperlipidemia     Subjective:   Bryan Green is a 49 y.o. male, presents to clinic for routine follow up on the conditions listed above.  Hyperlipidemia: Current Medication Regimen:  Was on lipitor 20 mg, had more than a year supply - would be about ready to run out, but pt has not taken for months and months. Last Lipids: Lab Results  Component Value Date   CHOL 206 (H) 06/21/2019   HDL 61 06/21/2019   LDLCALC 130 (H) 06/21/2019   TRIG 60 06/21/2019   CHOLHDL 3.4 06/21/2019   - Current Diet:  Eating a lot more fruits/veggies and avoiding fast foods.  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 - Denies: Chest pain, shortness of breath, myalgias. - Documented aortic atherosclerosis? No - Risk factors for atherosclerosis: hypercholesterolemia and hypertension  Some BPH last PSA 1.1 2019 - no change to urinary sx, he is urinating less w/o BP medicine.  No strain, dribbling, urinary retention nocturia.  Some frequency but he drinks a lot of water.  HTN in the past, well controlled with lifestyle and diet changes   Patient Active Problem List   Diagnosis Date Noted  . Hyperlipidemia 06/02/2018  . GERD (gastroesophageal reflux disease) 04/27/2018  . Hypertension 08/25/2017    Past Surgical History:  Procedure Laterality Date  . UPPER GI ENDOSCOPY Bilateral 2013    Family History  Problem Relation Age of Onset  . Diabetes Mother   . Cancer Father        Stomach  . Kidney disease Father   . Hypertension Father     Social History   Socioeconomic History  . Marital status: Divorced    Spouse name: Not on file  . Number of children: 3  . Years  of education: Not on file  . Highest education level: 12th grade  Occupational History  . Occupation: truck Geophysicist/field seismologist   Tobacco Use  . Smoking status: Former Smoker    Packs/day: 0.75    Years: 22.00    Pack years: 16.50  . Smokeless tobacco: Never Used  . Tobacco comment: patient stated that he quit 2010  Substance and Sexual Activity  . Alcohol use: No    Alcohol/week: 0.0 standard drinks  . Drug use: No  . Sexual activity: Yes    Partners: Female    Birth control/protection: None  Other Topics Concern  . 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   Social Determinants of Health   Financial Resource Strain:   . Difficulty of Paying Living Expenses: Not on file  Food Insecurity:   . Worried About Charity fundraiser in the Last Year: Not on file  . Ran Out of Food in the Last Year: Not on file  Transportation Needs:   . Lack of Transportation (Medical): Not on file  . Lack of Transportation (Non-Medical): Not on file  Physical Activity:   . Days of Exercise per Week: Not on file  . Minutes of Exercise per Session: Not  on file  Stress:   . Feeling of Stress : Not on file  Social Connections:   . Frequency of Communication with Friends and Family: Not on file  . Frequency of Social Gatherings with Friends and Family: Not on file  . Attends Religious Services: Not on file  . Active Member of Clubs or Organizations: Not on file  . Attends Archivist Meetings: Not on file  . Marital Status: Not on file  Intimate Partner Violence:   . Fear of Current or Ex-Partner: Not on file  . Emotionally Abused: Not on file  . Physically Abused: Not on file  . Sexually Abused: Not on file     Current Outpatient Medications:  .  atorvastatin (LIPITOR) 20 MG tablet, Take 1 tablet (20 mg total) by mouth daily., Disp: 90 tablet, Rfl: 1 .  pantoprazole (PROTONIX) 40 MG tablet, Take 1 tablet (40 mg total) by  mouth daily. (Patient not taking: Reported on 12/10/2019), Disp: 30 tablet, Rfl: 1  Allergies  Allergen Reactions  . Lisinopril     Dizziness, headache and drowsy     Chart Review Today: I personally reviewed active problem list, medication list, allergies, family history, social history, health maintenance, notes from last encounter, lab results, imaging with the patient/caregiver today.   Review of Systems  Constitutional: Negative.   HENT: Negative.   Eyes: Negative.   Respiratory: Negative.   Cardiovascular: Negative.   Gastrointestinal: Negative.   Endocrine: Negative.   Genitourinary: Negative.   Musculoskeletal: Negative.   Skin: Negative.   Allergic/Immunologic: Negative.   Neurological: Negative.   Hematological: Negative.   Psychiatric/Behavioral: Negative.   All other systems reviewed and are negative.    Objective:    Vitals:   12/10/19 0755  BP: 128/84  Pulse: 66  Resp: 16  Temp: (!) 96.9 F (36.1 C)  TempSrc: Temporal  SpO2: 98%  Weight: 183 lb 9.6 oz (83.3 kg)  Height: 5\' 7"  (1.702 m)    Body mass index is 28.76 kg/m.  Physical Exam Vitals and nursing note reviewed.  Constitutional:      General: He is not in acute distress.    Appearance: Normal appearance. He is well-developed. He is not ill-appearing, toxic-appearing or diaphoretic.     Interventions: Face mask in place.  HENT:     Head: Normocephalic and atraumatic.     Jaw: No trismus.     Right Ear: External ear normal.     Left Ear: External ear normal.  Eyes:     General: Lids are normal. No scleral icterus.    Conjunctiva/sclera: Conjunctivae normal.     Pupils: Pupils are equal, round, and reactive to light.  Neck:     Trachea: Trachea and phonation normal. No tracheal deviation.  Cardiovascular:     Rate and Rhythm: Normal rate and regular rhythm.     Pulses: Normal pulses.          Radial pulses are 2+ on the right side and 2+ on the left side.       Posterior tibial  pulses are 2+ on the right side and 2+ on the left side.     Heart sounds: Normal heart sounds. No murmur. No friction rub. No gallop.   Pulmonary:     Effort: Pulmonary effort is normal. No respiratory distress.     Breath sounds: Normal breath sounds. No stridor. No wheezing, rhonchi or rales.  Abdominal:     General: Bowel sounds are normal. There  is no distension.     Palpations: Abdomen is soft.     Tenderness: There is no abdominal tenderness. There is no guarding or rebound.  Musculoskeletal:        General: Normal range of motion.     Cervical back: Normal range of motion and neck supple.     Right lower leg: No edema.     Left lower leg: No edema.  Skin:    General: Skin is warm and dry.     Capillary Refill: Capillary refill takes less than 2 seconds.     Coloration: Skin is not jaundiced.     Findings: No rash.     Nails: There is no clubbing.  Neurological:     Mental Status: He is alert.     Cranial Nerves: No dysarthria or facial asymmetry.     Motor: No tremor or abnormal muscle tone.     Gait: Gait normal.  Psychiatric:        Mood and Affect: Mood normal.        Speech: Speech normal.        Behavior: Behavior normal. Behavior is cooperative.       PHQ2/9: Depression screen [facility] 2/9 12/10/2019 06/21/2019 12/14/2018 09/01/2018 06/02/2018  Decreased Interest 3 0 0 0 0  Down, Depressed, Hopeless 0 0 0 0 0  PHQ - 2 Score 3 0 0 0 0  Altered sleeping 0 0 0 0 0  Tired, decreased energy 0 0 0 0 0  Change in appetite 0 0 0 0 0  Feeling bad or failure about yourself  0 0 0 0 0  Trouble concentrating 0 0 0 0 0  Moving slowly or fidgety/restless 0 0 0 0 0  Suicidal thoughts 0 0 0 0 0  PHQ-9 Score 3 0 0 0 0  Difficult doing work/chores Not difficult at all Not difficult at all Not difficult at all Not difficult at all Not difficult at all    phq 9 is neg, score less than 3, reviewed today  Fall Risk: Fall Risk  12/10/2019 06/21/2019 12/14/2018 09/01/2018 06/02/2018  Falls  in the past year? 0 0 0 No No  Number falls in past yr: 0 0 0 - -  Injury with Fall? 0 0 0 - -  Follow up - Falls evaluation completed Falls evaluation completed - -    Functional Status Survey: Is the patient deaf or have difficulty hearing?: No Does the patient have difficulty seeing, even when wearing glasses/contacts?: No Does the patient have difficulty concentrating, remembering, or making decisions?: No Does the patient have difficulty walking or climbing stairs?: No Does the patient have difficulty dressing or bathing?: No Does the patient have difficulty doing errands alone such as visiting a doctor's office or shopping?: No   Assessment & Plan:   Pt still w/o insurance, cash price last time for visit and labs he states was over $1000 Off meds, similar to last visit in Sept, about 4 months ago, was originally scheduled for a CPE, but deferred due to cost, he will reschedule when his new job benefits kick in.  Will refill meds, sent to different pharmacy with coupon - printed for him after reviewing costs of lipitor at several local pharmacies through [email or web address]  1. Pure hypercholesterolemia Restart meds, defer labs until 3 -6 months of taking meds again, reviewed concerning SE/symptoms which he should f/up with [place] sooner - he verbalized understanding - atorvastatin (LIPITOR) 20 MG tablet; Take 1 tablet (  20 mg total) by mouth daily.  Dispense: 90 tablet; Refill: 1 - COMPLETE METABOLIC PANEL WITH GFR - Lipid panel  2. Essential hypertension Stable and well controlled with lifestyle/diet changes  3. Gastroesophageal reflux disease without esophagitis Sx resolved, no longer taking protonix or OTC meds, discussed how he can use prilosec, pepcid or zantac PRN     Return for 3-6 months for HLD and CPE when he has insurance again .   Delsa Grana, PA-C 12/10/19 8:33 AM

## 2020-03-20 IMAGING — CR DG CHEST 2V
2 series · 2 of 2 positions shown · non-contrast
Comparison: 07/25/2011

CLINICAL DATA: Chest pain, palpitations

EXAM:
CHEST - 2 VIEW

[chest pa]
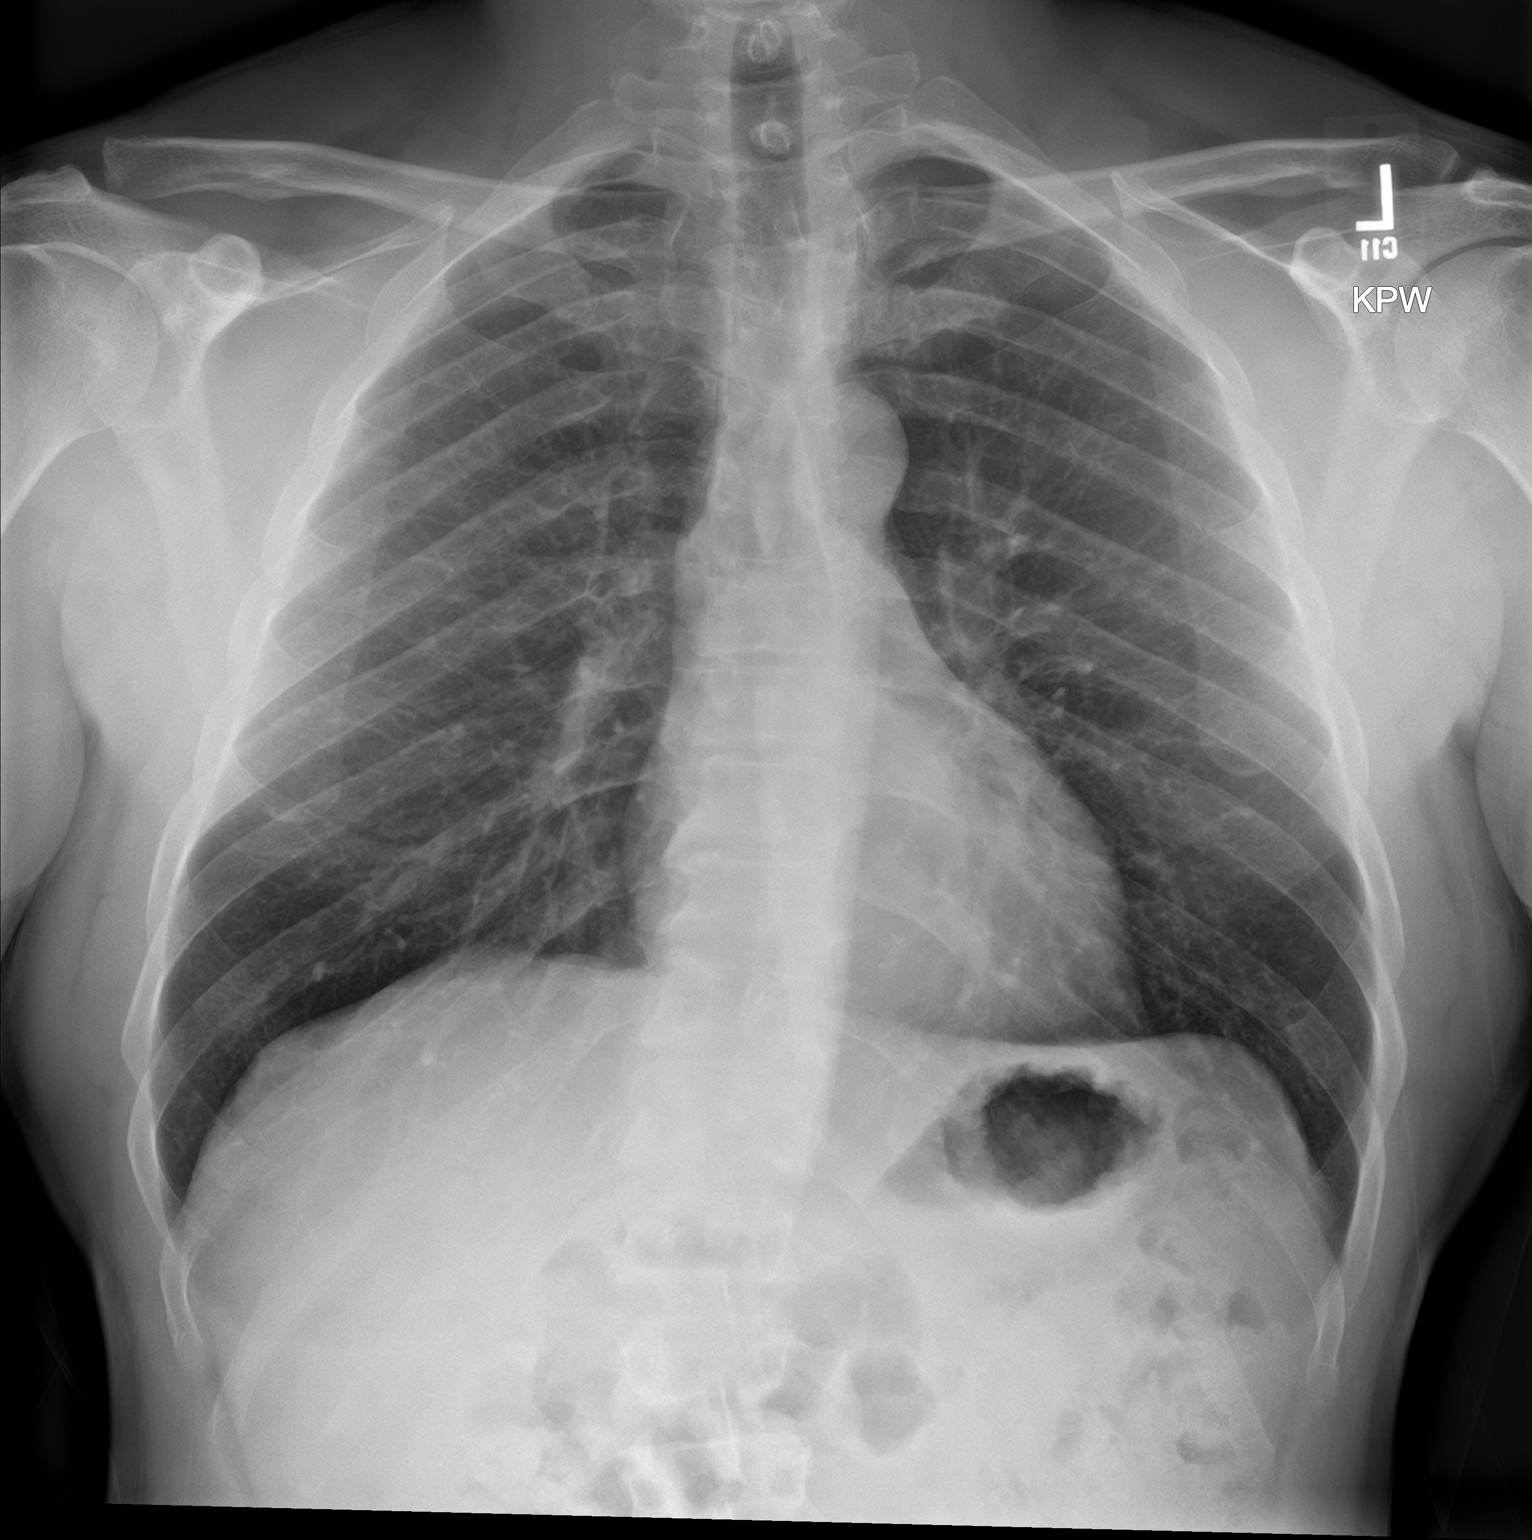

[chest lat]
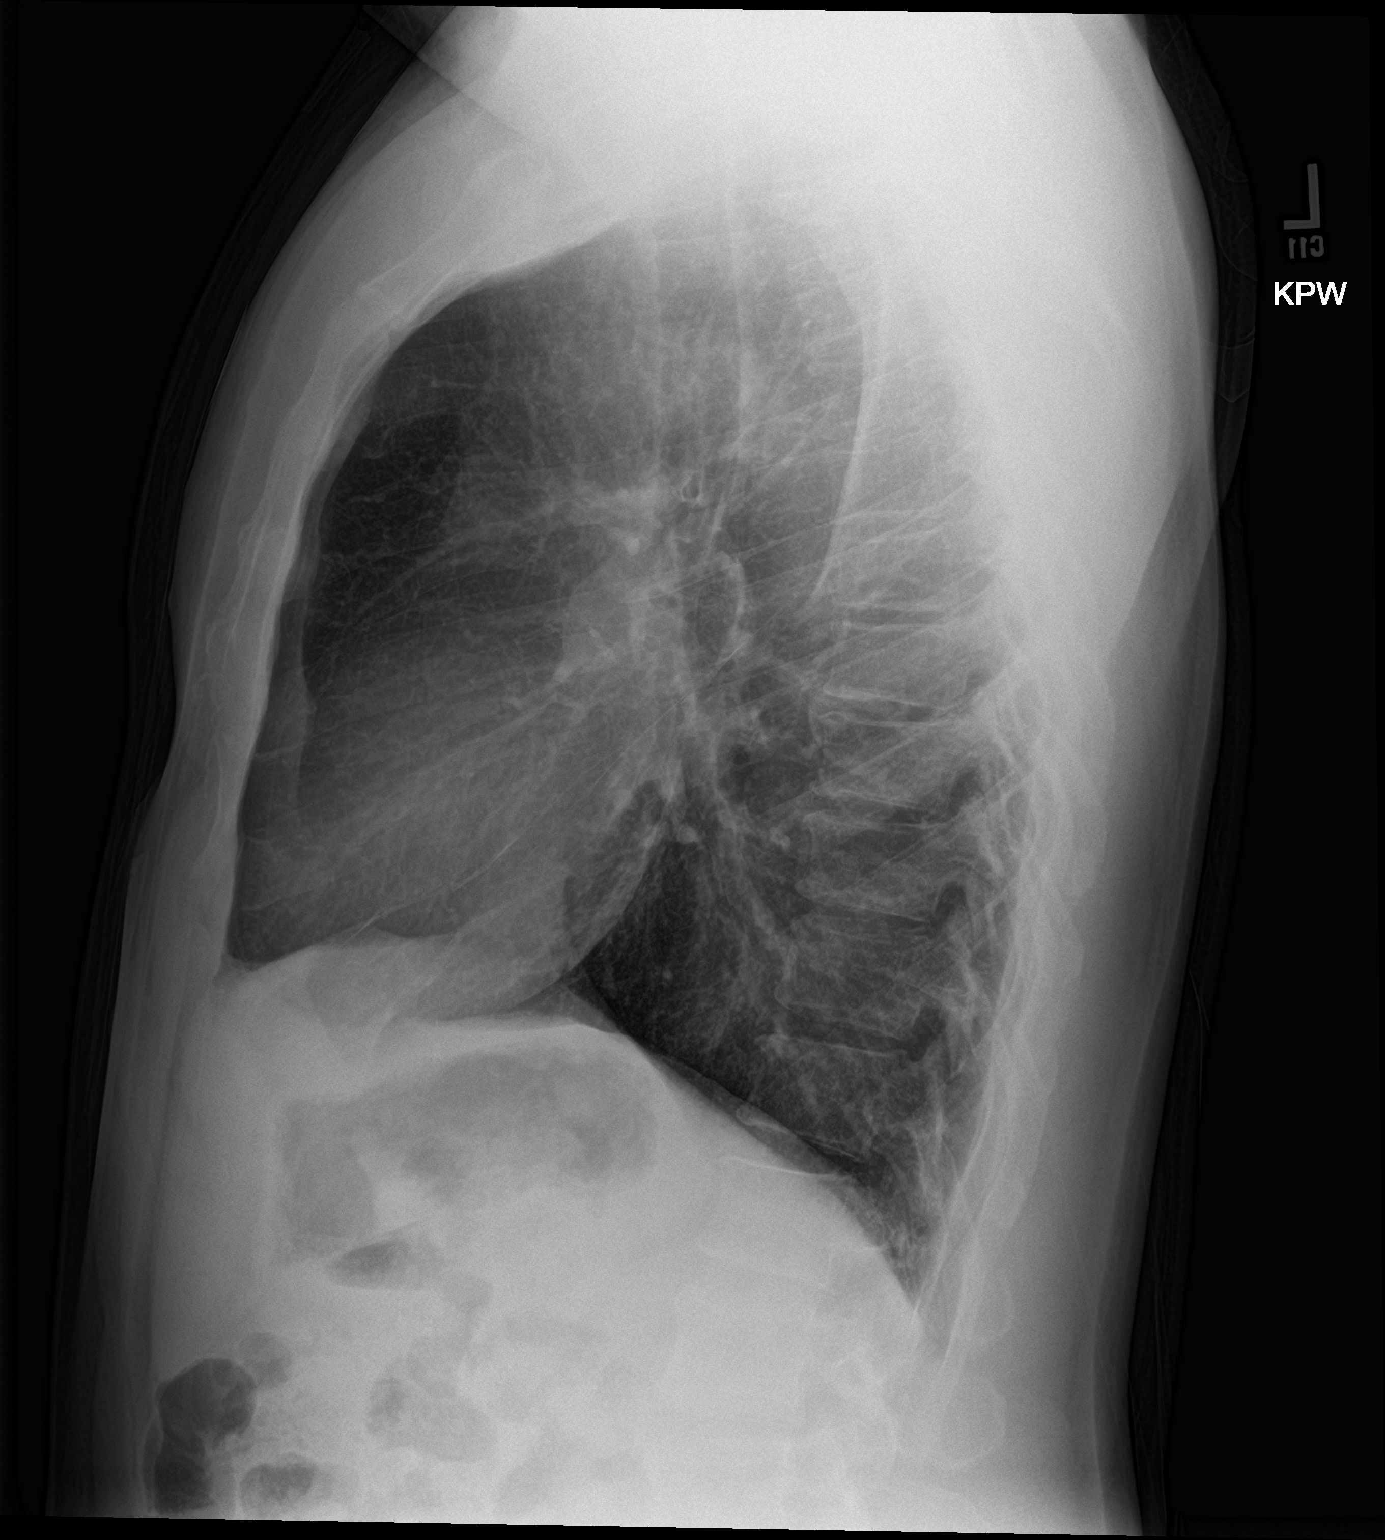

[2 of 2 positions shown; findings below may reference images not displayed]

FINDINGS: The heart size and mediastinal contours are within normal limits.
Both lungs are clear. The visualized skeletal structures are
unremarkable.
IMPRESSION: No acute abnormality of the lungs.

## 2020-05-12 ENCOUNTER — Other Ambulatory Visit: Payer: Self-pay

## 2020-05-12 ENCOUNTER — Encounter: Payer: Self-pay | Admitting: Family Medicine

## 2020-05-12 ENCOUNTER — Ambulatory Visit (INDEPENDENT_AMBULATORY_CARE_PROVIDER_SITE_OTHER): Payer: No Typology Code available for payment source | Admitting: Family Medicine

## 2020-05-12 VITALS — BP 132/78 | HR 64 | Temp 97.6°F | Resp 18 | Ht 67.0 in | Wt 181.0 lb

## 2020-05-12 DIAGNOSIS — R399 Unspecified symptoms and signs involving the genitourinary system: Secondary | ICD-10-CM

## 2020-05-12 DIAGNOSIS — I10 Essential (primary) hypertension: Secondary | ICD-10-CM

## 2020-05-12 DIAGNOSIS — E7800 Pure hypercholesterolemia, unspecified: Secondary | ICD-10-CM | POA: Diagnosis not present

## 2020-05-12 DIAGNOSIS — Z125 Encounter for screening for malignant neoplasm of prostate: Secondary | ICD-10-CM

## 2020-05-12 DIAGNOSIS — K219 Gastro-esophageal reflux disease without esophagitis: Secondary | ICD-10-CM | POA: Diagnosis not present

## 2020-05-12 DIAGNOSIS — R1310 Dysphagia, unspecified: Secondary | ICD-10-CM

## 2020-05-12 DIAGNOSIS — Z1211 Encounter for screening for malignant neoplasm of colon: Secondary | ICD-10-CM

## 2020-05-12 DIAGNOSIS — Z0000 Encounter for general adult medical examination without abnormal findings: Secondary | ICD-10-CM | POA: Diagnosis not present

## 2020-05-12 MED ORDER — TAMSULOSIN HCL 0.4 MG PO CAPS: 0.4000 mg | ORAL_CAPSULE | Freq: Every day | ORAL | 3 refills | Status: DC

## 2020-05-12 NOTE — Progress Notes (Signed)
Patient: Bryan Green, Male    DOB: 05/30/1971, 49 y.o.   MRN: [id number] Delsa Grana, PA-C Visit Date: 05/12/2020  Today's Provider: Delsa Grana, PA-C   Chief Complaint  Patient presents with   Annual Exam   Subjective:   Annual physical exam:  Bryan Green is a 49 y.o. male who presents today for health maintenance and annual & complete physical exam.  He feels well.  He reports exercising 3x a week- feels like his lifestyle has been so that he is more active Diet:  Smoothies, fruit, salads He reports he is sleeping well.   Pt wished to discuss do routine f/up on chronic conditions- was waiting to recheck cholesterol after restarting meds, labs deferred due to high cash cost.  Advised pt of separate   GERD:  Hx of, ER visit 07/2019, intermittent sx, "feels like food isn't going down good" trying to chew food well, drinking more water, sometimes bloated, decreased portions, not using meds   HLD:   He was able to get back on lipitor 20 mg, pt reports good med compliance Last Lipids: Lab Results  Component Value Date   CHOL 206 (H) 06/21/2019   HDL 61 06/21/2019   LDLCALC 130 (H) 06/21/2019   TRIG 60 06/21/2019   CHOLHDL 3.4 06/21/2019   - Denies: Chest pain, shortness of breath, myalgias, claudication  Some BPH last PSA 1.1 2019 - no change to urinary sx, he is urinating less w/o BP medicine.  No strain, dribbling, urinary retention nocturia.  Some frequency but he drinks a lot of water.  HTN in the past, well controlled with lifestyle and diet changes    USPSTF grade A and B recommendations - reviewed and addressed today  Depression:  Phq 9 completed today by patient, was reviewed by me with patient in the room, score is  negative, pt feels good PHQ 2/9 Scores 05/12/2020 12/10/2019 06/21/2019 12/14/2018  PHQ - 2 Score 0 3 0 0  PHQ- 9 Score 0 3 0 0   Depression screen [facility] 2/9 05/12/2020 12/10/2019 06/21/2019 12/14/2018 09/01/2018  Decreased Interest 0 3 0 0 0    Down, Depressed, Hopeless 0 0 0 0 0  PHQ - 2 Score 0 3 0 0 0  Altered sleeping 0 0 0 0 0  Tired, decreased energy 0 0 0 0 0  Change in appetite 0 0 0 0 0  Feeling bad or failure about yourself  0 0 0 0 0  Trouble concentrating 0 0 0 0 0  Moving slowly or fidgety/restless 0 0 0 0 0  Suicidal thoughts 0 0 0 0 0  PHQ-9 Score 0 3 0 0 0  Difficult doing work/chores Not difficult at all Not difficult at all Not difficult at all Not difficult at all Not difficult at all   Health Maintenance  Topic Date Due   COVID-19 Vaccine (1) Never done   COLONOSCOPY  Never done   INFLUENZA VACCINE  06/18/2020   TETANUS/TDAP  11/18/2020   Hepatitis C Screening  Completed   HIV Screening  Completed   COVID 1 done, next is in July  Hep C Screening: done STD testing and prevention (HIV/chl/gon/syphilis): HIV done, no STD testing needed Prostate cancer:  Prostate cancer screening with PSA: Discussed risks and benefits of PSA testing and provided handout. Pt wishes to have PSA drawn today.  Lab Results  Component Value Date   PSA 1.1 04/27/2018   Intimate partner violence:   One  male partner 7 years, no concerns  Urinary Symptoms:  IPSS Questionnaire (AUA-7): Over the past month   1)  How often have you had a sensation of not emptying your bladder completely after you finish urinating?  1 - Less than 1 time in 5  2)  How often have you had to urinate again less than two hours after you finished urinating? 4 - More than half the time  3)  How often have you found you stopped and started again several times when you urinated?  4 - More than half the time  4) How difficult have you found it to postpone urination?  1 - Less than 1 time in 5  5) How often have you had a weak urinary stream?  1 - Less than 1 time in 5  6) How often have you had to push or strain to begin urination?  0 - Not at all  7) How many times did you most typically get up to urinate from the time you went to bed until  the time you got up in the morning?  3 - 3 times  Total score:  0-7 mildly symptomatic   8-19 moderately symptomatic - 14   20-35 severely symptomatic    Advanced Care Planning:  Pt given packet    Patient does not have a living will at present time. If patient does have living will, I have requested they bring this to the clinic to be scanned in to their chart.   Skin cancer: Pt reports no hx of skin cancer, suspicious lesions/biopsies in the past.  Colorectal cancer:  colonoscopy is due - denies melena, hematochezia, change in BM/caliber  Lung cancer:  Low Dose CT Chest recommended if Age 23-80 years, 30 pack-year currently smoking OR have quit w/in 15years. Patient does not qualify.   Social History   Tobacco Use   Smoking status: Former Smoker    Packs/day: 0.75    Years: 22.00    Pack years: 16.50   Smokeless tobacco: Never Used   Tobacco comment: patient stated that he quit 2010  Substance Use Topics   Alcohol use: No    Alcohol/week: 0.0 standard drinks     Alcohol screening:   [facility] Visit from 05/12/2020 in [facility]  AUDIT-C Score 1      AAA: not due for age The USPSTF recommends one-time screening with ultrasonography in men ages 84 to 42 years who have ever smoked   Blood pressure/Hypertension: BP Readings from Last 3 Encounters:  05/12/20 132/78  12/10/19 128/84  07/30/19 134/85   Weight/Obesity: Wt Readings from Last 3 Encounters:  05/12/20 181 lb (82.1 kg)  12/10/19 183 lb 9.6 oz (83.3 kg)  07/30/19 170 lb (77.1 kg)   BMI Readings from Last 3 Encounters:  05/12/20 28.35 kg/m  12/10/19 28.76 kg/m  07/30/19 26.63 kg/m    Lipids:  Lab Results  Component Value Date   CHOL 206 (H) 06/21/2019   CHOL 190 12/14/2018   CHOL 195 04/27/2018   Lab Results  Component Value Date   HDL 61 06/21/2019   HDL 49 12/14/2018   HDL 49 04/27/2018   Lab Results  Component Value Date   LDLCALC 130 (H) 06/21/2019   LDLCALC 129  (H) 12/14/2018   LDLCALC 134 (H) 04/27/2018   Lab Results  Component Value Date   TRIG 60 06/21/2019   TRIG 39 12/14/2018   TRIG 41 04/27/2018   Lab Results  Component Value  Date   CHOLHDL 3.4 06/21/2019   CHOLHDL 3.9 12/14/2018   CHOLHDL 4.0 04/27/2018   No results found for: LDLDIRECT Based on the results of lipid panel his/her cardiovascular risk factor ( using [facility] )  in the next 10 years is : The 10-year ASCVD risk score Mikey Bussing DC Brooke Bonito., et al., 2013) is: 5.3%   Values used to calculate the score:     Age: 50 years     Sex: Male     Is Non-Hispanic African American: Yes     Diabetic: No     Tobacco smoker: No     Systolic Blood Pressure: 751 mmHg     Is BP treated: No     HDL Cholesterol: 61 mg/dL     Total Cholesterol: 206 mg/dL Glucose:  Glucose, Bld  Date Value Ref Range Status  07/30/2019 96 70 - 99 mg/dL Final  12/14/2018 80 65 - 99 mg/dL Final    Comment:    .            Fasting reference interval .   04/27/2018 86 65 - 99 mg/dL Final    Comment:    .            Fasting reference interval .     Social History      He  reports that he has quit smoking. He has a 16.50 pack-year smoking history. He has never used smokeless tobacco. He reports that he does not drink alcohol and does not use drugs.       Social History   Socioeconomic History   Marital status: Divorced    Spouse name: Not on file   Number of children: 3   Years of education: Not on file   Highest education level: 12th grade  Occupational History   Occupation: truck driver   Tobacco Use   Smoking status: Former Smoker    Packs/day: 0.75    Years: 22.00    Pack years: 16.50   Smokeless tobacco: Never Used   Tobacco comment: patient stated that he quit 2010  Vaping Use   Vaping Use: Never used  Substance and Sexual Activity   Alcohol use: No    Alcohol/week: 0.0 standard drinks   Drug use: No   Sexual activity: Yes    Partners: Female    Birth  control/protection: None  Other Topics Concern   Not on file  Social History Narrative   He is a Administrator, 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   Social Determinants of Health   Financial Resource Strain:    Difficulty of Paying Living Expenses:   Food Insecurity:    Worried About Charity fundraiser in the Last Year:    Arboriculturist in the Last Year:   Transportation Needs:    Film/video editor (Medical):    Lack of Transportation (Non-Medical):   Physical Activity:    Days of Exercise per Week:    Minutes of Exercise per Session:   Stress:    Feeling of Stress :   Social Connections:    Frequency of Communication with Friends and Family:    Frequency of Social Gatherings with Friends and Family:    Attends Religious Services:    Active Member of Clubs or Organizations:    Attends Archivist Meetings:    Marital Status:          Social History   Socioeconomic History  Marital status: Divorced    Spouse name: Not on file   Number of children: 3   Years of education: Not on file   Highest education level: 12th grade  Occupational History   Occupation: truck driver   Tobacco Use   Smoking status: Former Smoker    Packs/day: 0.75    Years: 22.00    Pack years: 16.50   Smokeless tobacco: Never Used   Tobacco comment: patient stated that he quit 2010  Vaping Use   Vaping Use: Never used  Substance and Sexual Activity   Alcohol use: No    Alcohol/week: 0.0 standard drinks   Drug use: No   Sexual activity: Yes    Partners: Female    Birth control/protection: None  Other Topics Concern   Not on file  Social History Narrative   He is a Administrator, 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   Social Determinants of Health   Financial Resource Strain:    Difficulty of Paying Living Expenses:   Food Insecurity:    Worried About Charity fundraiser in the Last Year:    Academic librarian in the Last Year:   Transportation Needs:    Film/video editor (Medical):    Lack of Transportation (Non-Medical):   Physical Activity:    Days of Exercise per Week:    Minutes of Exercise per Session:   Stress:    Feeling of Stress :   Social Connections:    Frequency of Communication with Friends and Family:    Frequency of Social Gatherings with Friends and Family:    Attends Religious Services:    Active Member of Clubs or Organizations:    Attends Music therapist:    Marital Status:   Intimate Partner Violence:    Fear of Current or Ex-Partner:    Emotionally Abused:    Physically Abused:    Sexually Abused:     Family History        Family Status  Relation Name Status   Mother  Alive   Father  Deceased   Sister  Alive        His family history includes Cancer in his father; Diabetes in his mother; Hypertension in his father; Kidney disease in his father.       Family History  Problem Relation Age of Onset   Diabetes Mother    Cancer Father        Stomach   Kidney disease Father    Hypertension Father     Patient Active Problem List   Diagnosis Date Noted   Hyperlipidemia 06/02/2018   GERD (gastroesophageal reflux disease) 04/27/2018   Hypertension 08/25/2017    Past Surgical History:  Procedure Laterality Date   UPPER GI ENDOSCOPY Bilateral 2013     Current Outpatient Medications:    atorvastatin (LIPITOR) 20 MG tablet, Take 1 tablet (20 mg total) by mouth daily., Disp: 90 tablet, Rfl: 1  Allergies  Allergen Reactions   Lisinopril     Dizziness, headache and drowsy     Patient Care Team: Delsa Grana, PA-C as PCP - General (Family Medicine)  I personally reviewed active problem list, medication list, allergies, family history, social history, health maintenance, notes from last encounter, lab results, imaging with the patient/caregiver today.   Review of Systems  10 Systems reviewed and  are negative for acute change except as noted in the HPI.  Objective:   Vitals:  Vitals:   05/12/20 0943  BP: 132/78  Pulse: 64  Resp: 18  Temp: 97.6 F (36.4 C)  TempSrc: Temporal  SpO2: 99%  Weight: 181 lb (82.1 kg)  Height: 5\' 7"  (1.702 m)    Body mass index is 28.35 kg/m.  Physical Exam Vitals and nursing note reviewed.  Constitutional:      General: He is not in acute distress.    Appearance: Normal appearance. He is well-developed. He is not ill-appearing, toxic-appearing or diaphoretic.  HENT:     Head: Normocephalic and atraumatic.     Jaw: No trismus.     Right Ear: Tympanic membrane, ear canal and external ear normal. There is no impacted cerumen.     Left Ear: Tympanic membrane, ear canal and external ear normal. There is no impacted cerumen.     Nose: No mucosal edema.     Right Sinus: No maxillary sinus tenderness or frontal sinus tenderness.     Left Sinus: No maxillary sinus tenderness or frontal sinus tenderness.     Mouth/Throat:     Pharynx: Uvula midline. No uvula swelling.  Eyes:     General: Lids are normal. No scleral icterus.       Right eye: No discharge.        Left eye: No discharge.     Conjunctiva/sclera: Conjunctivae normal.     Pupils: Pupils are equal, round, and reactive to light.  Neck:     Thyroid: No thyroid mass, thyromegaly or thyroid tenderness.     Trachea: Trachea and phonation normal. No tracheal deviation.  Cardiovascular:     Rate and Rhythm: Normal rate and regular rhythm.     Pulses: Normal pulses.          Radial pulses are 2+ on the right side and 2+ on the left side.       Posterior tibial pulses are 2+ on the right side and 2+ on the left side.     Heart sounds: Normal heart sounds. No murmur heard.  No friction rub. No gallop.   Pulmonary:     Effort: Pulmonary effort is normal.     Breath sounds: Normal breath sounds. No wheezing, rhonchi or rales.  Abdominal:     General: Bowel sounds are normal. There  is no distension.     Palpations: Abdomen is soft.     Tenderness: There is no abdominal tenderness. There is no guarding or rebound.  Musculoskeletal:     Right lower leg: No edema.     Left lower leg: No edema.  Skin:    General: Skin is warm and dry.     Capillary Refill: Capillary refill takes less than 2 seconds.     Findings: No rash.  Neurological:     Mental Status: He is alert.     Gait: Gait normal.  Psychiatric:        Mood and Affect: Mood normal.        Speech: Speech normal.        Behavior: Behavior normal.      PHQ2/9: Depression screen [facility] 2/9 05/12/2020 12/10/2019 06/21/2019 12/14/2018 09/01/2018  Decreased Interest 0 3 0 0 0  Down, Depressed, Hopeless 0 0 0 0 0  PHQ - 2 Score 0 3 0 0 0  Altered sleeping 0 0 0 0 0  Tired, decreased energy 0 0 0 0 0  Change in appetite 0 0 0 0 0  Feeling bad or  failure about yourself  0 0 0 0 0  Trouble concentrating 0 0 0 0 0  Moving slowly or fidgety/restless 0 0 0 0 0  Suicidal thoughts 0 0 0 0 0  PHQ-9 Score 0 3 0 0 0  Difficult doing work/chores Not difficult at all Not difficult at all Not difficult at all Not difficult at all Not difficult at all  PHQ reviewed today, neg Fall risk reviewed today neg  Fall Risk: Fall Risk  05/12/2020 12/10/2019 06/21/2019 12/14/2018 09/01/2018  Falls in the past year? 0 0 0 0 No  Number falls in past yr: 0 0 0 0 -  Injury with Fall? 0 0 0 0 -  Follow up Falls evaluation completed - Falls evaluation completed Falls evaluation completed -    Functional Status Survey: Is the patient deaf or have difficulty hearing?: No Does the patient have difficulty seeing, even when wearing glasses/contacts?: No Does the patient have difficulty concentrating, remembering, or making decisions?: No Does the patient have difficulty walking or climbing stairs?: No Does the patient have difficulty dressing or bathing?: No Does the patient have difficulty doing errands alone such as visiting a doctor's  office or shopping?: No   Assessment & Plan:    CPE completed today   Prostate cancer screening and PSA options (with potential risks and benefits of testing vs not testing) were discussed along with recent recs/guidelines, shared decision making and handout/information given to pt today   USPSTF grade A and B recommendations reviewed with patient; age-appropriate recommendations, preventive care, screening tests, etc discussed and encouraged; healthy living encouraged; see AVS for patient education given to patient   Discussed importance of 150 minutes of physical activity weekly, AHA exercise recommendations given to pt in AVS/handout   Discussed importance of healthy diet:  eating lean meats and proteins, avoiding trans fats and saturated fats, avoid simple sugars and excessive carbs in diet, eat 6 servings of fruit/vegetables daily and drink plenty of water and avoid sweet beverages.  DASH diet reviewed if pt has HTN   Recommended pt to do annual eye exam and routine dental exams/cleanings   Reviewed Health Maintenance: Health Maintenance  Topic Date Due   COVID-19 Vaccine (1) Never done   COLONOSCOPY  Never done   INFLUENZA VACCINE  06/18/2020   TETANUS/TDAP  11/18/2020   Hepatitis C Screening  Completed   HIV Screening  Completed     Immunizations: Immunization History  Administered Date(s) Administered   Influenza,inj,Quad PF,6+ Mos 08/01/2015, 09/22/2017, 09/01/2018, 12/10/2019   Tdap 11/18/2010   1. Adult general medical exam - CBC with Differential/Platelet - COMPLETE METABOLIC PANEL WITH GFR - Lipid panel - PSA  2. Pure hypercholesterolemia Recently restarted lipitor 20 mg, compliant, no SE or concerns, working on diet and exercise, due for recheck labs - refilled based on lab results (dose adjustment etc) - COMPLETE METABOLIC PANEL WITH GFR - Lipid panel  3. Essential hypertension Well controlled and stable, currently managing with lifestyle -  COMPLETE METABOLIC PANEL WITH GFR  4. Gastroesophageal reflux disease, unspecified whether esophagitis present Intermittent reflux, indigestion, some bloating/fullness - not using meds regularly, pt encouraged to try pepcid PRN - Ambulatory referral to Gastroenterology  5. Dysphagia, unspecified type He describes food feeling stuck in central chest area, has been careful with chewing foods better, smaller bites, drinking more liquids - Ambulatory referral to Gastroenterology  6. Screening for malignant neoplasm of colon No current sx concerning for [facility] - Ambulatory referral to Gastroenterology  7. Lower urinary tract symptoms (LUTS) Moderate sx - frequency, double voiding, some dribbling, nocturia Prostate cancer screening with PSA: Discussed risks and benefits of PSA testing and provided handout  Discussed poor screening recommendations -  But we have old labs to compare to and I have encouraged pt to consider urology referral for sx Trial of tamsulosin - PSA - Urinalysis, Routine w reflex microscopic  8. Screening for malignant neoplasm of prostate See #7 - PSA     Delsa Grana, PA-C 05/12/20 10:02 AM  [facility]

## 2020-05-12 NOTE — Patient Instructions (Signed)
Preventive Care 50-50 Years Old, Male Preventive care refers to lifestyle choices and visits with your health care provider that can promote health and wellness. This includes:  A yearly physical exam. This is also called an annual well check.  Regular dental and eye exams.  Immunizations.  Screening for certain conditions.  Healthy lifestyle choices, such as eating a healthy diet, getting regular exercise, not using drugs or products that contain nicotine and tobacco, and limiting alcohol use. What can I expect for my preventive care visit? Physical exam Your health care provider will check:  Height and weight. These may be used to calculate body mass index (BMI), which is a measurement that tells if you are at a healthy weight.  Heart rate and blood pressure.  Your skin for abnormal spots. Counseling Your health care provider may ask you questions about:  Alcohol, tobacco, and drug use.  Emotional well-being.  Home and relationship well-being.  Sexual activity.  Eating habits.  Work and work Statistician. What immunizations do I need?  Influenza (flu) vaccine  This is recommended every year. Tetanus, diphtheria, and pertussis (Tdap) vaccine  You may need a Td booster every 10 years. Varicella (chickenpox) vaccine  You may need this vaccine if you have not already been vaccinated. Zoster (shingles) vaccine  You may need this after age 50. Measles, mumps, and rubella (MMR) vaccine  You may need at least one dose of MMR if you were born in 1957 or later. You may also need a second dose. Pneumococcal conjugate (PCV13) vaccine  You may need this if you have certain conditions and were not previously vaccinated. Pneumococcal polysaccharide (PPSV23) vaccine  You may need one or two doses if you smoke cigarettes or if you have certain conditions. Meningococcal conjugate (MenACWY) vaccine  You may need this if you have certain conditions. Hepatitis A  vaccine  You may need this if you have certain conditions or if you travel or work in places where you may be exposed to hepatitis A. Hepatitis B vaccine  You may need this if you have certain conditions or if you travel or work in places where you may be exposed to hepatitis B. Haemophilus influenzae type b (Hib) vaccine  You may need this if you have certain risk factors. Human papillomavirus (HPV) vaccine  If recommended by your health care provider, you may need three doses over 6 months. You may receive vaccines as individual doses or as more than one vaccine together in one shot (combination vaccines). Talk with your health care provider about the risks and benefits of combination vaccines. What tests do I need? Blood tests  Lipid and cholesterol levels. These may be checked every 5 years, or more frequently if you are over 60 years old.  Hepatitis C test.  Hepatitis B test. Screening  Lung cancer screening. You may have this screening every year starting at age 50 if you have a 30-pack-year history of smoking and currently smoke or have quit within the past 15 years.  Prostate cancer screening. Recommendations will vary depending on your family history and other risks.  Colorectal cancer screening. All adults should have this screening starting at age 72 and continuing until age 2. Your health care provider may recommend screening at age 50 if you are at increased risk. You will have tests every 1-10 years, depending on your results and the type of screening test.  Diabetes screening. This is done by checking your blood sugar (glucose) after you have not eaten  for a while (fasting). You may have this done every 1-3 years.  Sexually transmitted disease (STD) testing. Follow these instructions at home: Eating and drinking  Eat a diet that includes fresh fruits and vegetables, whole grains, lean protein, and low-fat dairy products.  Take vitamin and mineral supplements as  recommended by your health care provider.  Do not drink alcohol if your health care provider tells you not to drink.  If you drink alcohol: ? Limit how much you have to 0-2 drinks a day. ? Be aware of how much alcohol is in your drink. In the [place] one drink equals one 12 oz bottle of beer (355 mL), one 5 oz glass of wine (148 mL), or one 1 oz glass of hard liquor (44 mL). Lifestyle  Take daily care of your teeth and gums.  Stay active. Exercise for at least 30 minutes on 5 or more days each week.  Do not use any products that contain nicotine or tobacco, such as cigarettes, e-cigarettes, and chewing tobacco. If you need help quitting, ask your health care provider.  If you are sexually active, practice safe sex. Use a condom or other form of protection to prevent STIs (sexually transmitted infections).  Talk with your health care provider about taking a low-dose aspirin every day starting at age 50. What's next?  Go to your health care provider once a year for a well check visit.  Ask your health care provider how often you should have your eyes and teeth checked.  Stay up to date on all vaccines. This information is not intended to replace advice given to you by your health care provider. Make sure you discuss any questions you have with your health care provider. Document Revised: 10/29/2018 Document Reviewed: 10/29/2018 Elsevier Patient Education  2020 [employer].

## 2020-05-13 LAB — URINALYSIS, ROUTINE W REFLEX MICROSCOPIC
Bilirubin Urine: NEGATIVE
Glucose, UA: NEGATIVE
Hgb urine dipstick: NEGATIVE
Ketones, ur: NEGATIVE
Leukocytes,Ua: NEGATIVE
Nitrite: NEGATIVE
Protein, ur: NEGATIVE
Specific Gravity, Urine: 1.007 (ref 1.001–1.03)
pH: 6.5 (ref 5.0–8.0)

## 2020-05-13 LAB — CBC WITH DIFFERENTIAL/PLATELET
Absolute Monocytes: 531 cells/uL (ref 200–950)
Basophils Absolute: 52 cells/uL (ref 0–200)
Basophils Relative: 1.1 %
Eosinophils Absolute: 291 cells/uL (ref 15–500)
Eosinophils Relative: 6.2 %
HCT: 42.4 % (ref 38.5–50.0)
Hemoglobin: 13.7 g/dL (ref 13.2–17.1)
Lymphs Abs: 1429 cells/uL (ref 850–3900)
MCH: 27.5 pg (ref 27.0–33.0)
MCHC: 32.3 g/dL (ref 32.0–36.0)
MCV: 85 fL (ref 80.0–100.0)
MPV: 11.3 fL (ref 7.5–12.5)
Monocytes Relative: 11.3 %
Neutro Abs: 2397 cells/uL (ref 1500–7800)
Neutrophils Relative %: 51 %
Platelets: 202 10*3/uL (ref 140–400)
RBC: 4.99 10*6/uL (ref 4.20–5.80)
RDW: 12.5 % (ref 11.0–15.0)
Total Lymphocyte: 30.4 %
WBC: 4.7 10*3/uL (ref 3.8–10.8)

## 2020-05-13 LAB — COMPLETE METABOLIC PANEL WITH GFR
AG Ratio: 2 (calc) (ref 1.0–2.5)
ALT: 30 U/L (ref 9–46)
AST: 31 U/L (ref 10–35)
Albumin: 4.7 g/dL (ref 3.6–5.1)
Alkaline phosphatase (APISO): 70 U/L (ref 35–144)
BUN: 14 mg/dL (ref 7–25)
CO2: 30 mmol/L (ref 20–32)
Calcium: 9.8 mg/dL (ref 8.6–10.3)
Chloride: 104 mmol/L (ref 98–110)
Creat: 1.28 mg/dL (ref 0.70–1.33)
GFR, Est African American: 75 mL/min/{1.73_m2} (ref >=60)
GFR, Est Non African American: 65 mL/min/{1.73_m2} (ref >=60)
Globulin: 2.3 g/dL (calc) (ref 1.9–3.7)
Glucose, Bld: 87 mg/dL (ref 65–99)
Potassium: 4.7 mmol/L (ref 3.5–5.3)
Sodium: 141 mmol/L (ref 135–146)
Total Bilirubin: 0.5 mg/dL (ref 0.2–1.2)
Total Protein: 7 g/dL (ref 6.1–8.1)

## 2020-05-13 LAB — LIPID PANEL
Cholesterol: 135 mg/dL (ref <200)
HDL: 50 mg/dL (ref >=40)
LDL Cholesterol (Calc): 74 mg/dL (calc)
Non-HDL Cholesterol (Calc): 85 mg/dL (calc) (ref <130)
Total CHOL/HDL Ratio: 2.7 (calc) (ref <5.0)
Triglycerides: 40 mg/dL (ref <150)

## 2020-05-13 LAB — PSA: PSA: 0.9 ng/mL (ref <=4.0)

## 2020-05-15 ENCOUNTER — Other Ambulatory Visit: Payer: Self-pay | Admitting: Family Medicine

## 2020-05-15 ENCOUNTER — Ambulatory Visit: Payer: Self-pay | Admitting: *Deleted

## 2020-05-15 DIAGNOSIS — E7800 Pure hypercholesterolemia, unspecified: Secondary | ICD-10-CM

## 2020-05-15 MED ORDER — ATORVASTATIN CALCIUM 20 MG PO TABS: 20.0000 mg | ORAL_TABLET | Freq: Every day | ORAL | 3 refills | Status: DC

## 2020-05-15 NOTE — Telephone Encounter (Signed)
Pt given lab results per notes of Delsa Grana, PA on 05/15/20. Pt verbalized understanding. Patient requesting call back to set up referral for colonoscopy.

## 2020-05-16 NOTE — Telephone Encounter (Signed)
Looks like GI has already tried to reach out to him to schedule and left vm.  I called left vm with their # for him to call and schedule

## 2020-06-08 ENCOUNTER — Ambulatory Visit: Payer: Self-pay | Admitting: Family Medicine

## 2020-08-02 ENCOUNTER — Ambulatory Visit: Payer: Self-pay | Admitting: Gastroenterology

## 2020-08-12 ENCOUNTER — Other Ambulatory Visit: Payer: Self-pay | Admitting: Family Medicine

## 2020-10-18 ENCOUNTER — Ambulatory Visit (INDEPENDENT_AMBULATORY_CARE_PROVIDER_SITE_OTHER): Payer: No Typology Code available for payment source | Admitting: Gastroenterology

## 2020-10-18 ENCOUNTER — Other Ambulatory Visit: Payer: Self-pay

## 2020-10-18 VITALS — BP 173/105 | HR 70 | Temp 98.1°F | Ht 67.0 in | Wt 192.0 lb

## 2020-10-18 DIAGNOSIS — Z1211 Encounter for screening for malignant neoplasm of colon: Secondary | ICD-10-CM | POA: Diagnosis not present

## 2020-10-18 DIAGNOSIS — R1310 Dysphagia, unspecified: Secondary | ICD-10-CM

## 2020-10-18 MED ORDER — OMEPRAZOLE 40 MG PO CPDR: 40.0000 mg | DELAYED_RELEASE_CAPSULE | Freq: Every day | ORAL | 1 refills | Status: DC

## 2020-10-18 MED ORDER — NA SULFATE-K SULFATE-MG SULF 17.5-3.13-1.6 GM/177ML PO SOLN: 1.0000 | Freq: Once | ORAL | 0 refills | Status: AC

## 2020-10-18 NOTE — Progress Notes (Signed)
Jonathon Bellows MD, MRCP(U.K) [address]  Bryan  Green, [place] [address]  Main: [phone]  Fax: [phone]   Gastroenterology Consultation  Referring Provider:     Delsa Grana, PA-C Primary Care Physician:  Delsa Grana, PA-C Primary Gastroenterologist:  Dr. Jonathon Bellows  Reason for Consultation:    Dysphagia         HPI:   Bryan Green is a 49 y.o. y/o male referred for consultation & management  by  Delsa Grana, PA-C.    Dysphagia: Onset and any progression: Few years and has been not progressing Frequency: Occasionally Foods affected : Solids Prior episodes of impaction: No History of asthma/allergy : No History of heartburn/Reflux : Occasionally Weight loss/weight gain : Weight gain Prior EGD: None PPI/H2 blocker use : None Smoking history :.   No prior colonoscopy due for colon cancer screening.  No family history of colon cancer or polyps.  No lower GI symptoms. Past Medical History:  Diagnosis Date  . Depression    pt. does not recall ever being diagnosed with depression, neither being on medications.   . Hypertension     Past Surgical History:  Procedure Laterality Date  . UPPER GI ENDOSCOPY Bilateral 2013    Prior to Admission medications   Medication Sig Start Date End Date Taking? Authorizing Provider  tamsulosin (FLOMAX) 0.4 MG CAPS capsule TAKE 1 CAPSULE BY MOUTH EVERYDAY AT BEDTIME 08/14/20   Delsa Grana, PA-C  atorvastatin (LIPITOR) 20 MG tablet Take 1 tablet (20 mg total) by mouth daily. 05/15/20   Delsa Grana, PA-C    Family History  Problem Relation Age of Onset  . Diabetes Mother   . Cancer Father        Stomach  . Kidney disease Father   . Hypertension Father      Social History   Tobacco Use  . Smoking status: Former Smoker    Packs/day: 0.75    Years: 22.00    Pack years: 16.50  . Smokeless tobacco: Never Used  . Tobacco comment: patient stated that he quit 2010  Vaping Use  . Vaping Use: Never used    Substance Use Topics  . Alcohol use: No    Alcohol/week: 0.0 standard drinks  . Drug use: No    Allergies as of 10/18/2020 - Review Complete 05/12/2020  Allergen Reaction Noted  . Lisinopril  09/22/2017    Review of Systems:    All systems reviewed and negative except where noted in HPI.   Physical Exam:  There were no vitals taken for this visit. No LMP for male patient. Psych:  Alert and cooperative. Normal mood and affect. General:   Alert,  Well-developed, well-nourished, pleasant and cooperative in NAD Head:  Normocephalic and atraumatic. Eyes:  Sclera clear, no icterus.   Conjunctiva pink. Ears:  Normal auditory acuity. Lungs:  Respirations even and unlabored.  Clear throughout to auscultation.   No wheezes, crackles, or rhonchi. No acute distress. Heart:  Regular rate and rhythm; no murmurs, clicks, rubs, or gallops. Abdomen:  Normal bowel sounds.  No bruits.  Soft, non-tender and non-distended without masses, hepatosplenomegaly or hernias noted.  No guarding or rebound tenderness.    Neurologic:  Alert and oriented x3;  grossly normal neurologically. Psych:  Alert and cooperative. Normal mood and affect.  Imaging Studies: No results found.  Assessment and Plan:   Bryan Green is a 49 y.o. y/o male has been referred for dysphagia and colon cancer screening.  Plan 1.  EGD plus colonoscopy at the same time to evaluate for dysphagia, rule out eosinophilic esophagitis and colon cancer screening. 2.  Commence on Prilosec 40 mg once a day to empirically treat for any acid related reflux and dysphagia.  I have discussed alternative options, risks & benefits,  which include, but are not limited to, bleeding, infection, perforation,respiratory complication & drug reaction.  The patient agrees with this plan & written consent will be obtained.      Follow up in 12 weeks telephone visit  Dr Jonathon Bellows MD,MRCP(U.K)

## 2020-11-13 ENCOUNTER — Ambulatory Visit: Payer: No Typology Code available for payment source | Admitting: Family Medicine

## 2020-11-29 ENCOUNTER — Other Ambulatory Visit: Payer: Self-pay

## 2020-11-29 ENCOUNTER — Other Ambulatory Visit
Admission: RE | Admit: 2020-11-29 | Discharge: 2020-11-29 | Disposition: A | Discharge status: Home / Self Care | Payer: PRIVATE HEALTH INSURANCE | Source: Ambulatory Visit | Attending: Gastroenterology | Admitting: Gastroenterology

## 2020-11-29 DIAGNOSIS — Z20822 Contact with and (suspected) exposure to covid-19: Secondary | ICD-10-CM | POA: Insufficient documentation

## 2020-11-29 DIAGNOSIS — Z01812 Encounter for preprocedural laboratory examination: Secondary | ICD-10-CM | POA: Diagnosis not present

## 2020-11-29 LAB — SARS CORONAVIRUS 2 (TAT 6-24 HRS): SARS Coronavirus 2: NEGATIVE

## 2020-12-01 ENCOUNTER — Ambulatory Visit
Admission: RE | Admit: 2020-12-01 | Discharge: 2020-12-01 | Disposition: A | Discharge status: Home / Self Care | Payer: PRIVATE HEALTH INSURANCE | Attending: Gastroenterology | Admitting: Gastroenterology

## 2020-12-01 ENCOUNTER — Other Ambulatory Visit: Payer: Self-pay

## 2020-12-01 ENCOUNTER — Encounter
Admission: RE | Disposition: A | Discharge status: Home / Self Care | Payer: Self-pay | Source: Home / Self Care | Attending: Gastroenterology

## 2020-12-01 ENCOUNTER — Ambulatory Visit: Payer: PRIVATE HEALTH INSURANCE | Admitting: Certified Registered"

## 2020-12-01 ENCOUNTER — Encounter: Payer: Self-pay | Admitting: Gastroenterology

## 2020-12-01 DIAGNOSIS — D132 Benign neoplasm of duodenum: Secondary | ICD-10-CM

## 2020-12-01 DIAGNOSIS — K5730 Diverticulosis of large intestine without perforation or abscess without bleeding: Secondary | ICD-10-CM | POA: Insufficient documentation

## 2020-12-01 DIAGNOSIS — Z79899 Other long term (current) drug therapy: Secondary | ICD-10-CM | POA: Insufficient documentation

## 2020-12-01 DIAGNOSIS — Z87891 Personal history of nicotine dependence: Secondary | ICD-10-CM | POA: Diagnosis not present

## 2020-12-01 DIAGNOSIS — D181 Lymphangioma, any site: Secondary | ICD-10-CM | POA: Diagnosis not present

## 2020-12-01 DIAGNOSIS — Z1211 Encounter for screening for malignant neoplasm of colon: Secondary | ICD-10-CM | POA: Insufficient documentation

## 2020-12-01 DIAGNOSIS — R1310 Dysphagia, unspecified: Secondary | ICD-10-CM | POA: Insufficient documentation

## 2020-12-01 HISTORY — PX: ESOPHAGOGASTRODUODENOSCOPY (EGD) WITH PROPOFOL: SHX5813

## 2020-12-01 HISTORY — PX: COLONOSCOPY WITH PROPOFOL: SHX5780

## 2020-12-01 SURGERY — COLONOSCOPY WITH PROPOFOL
Anesthesia: General

## 2020-12-01 MED ORDER — PROPOFOL 500 MG/50ML IV EMUL
INTRAVENOUS | Status: DC | PRN
  Administered 2020-12-01: 185 ug/kg/min via INTRAVENOUS

## 2020-12-01 MED ORDER — FENTANYL CITRATE (PF) 100 MCG/2ML IJ SOLN
INTRAMUSCULAR | Status: DC | PRN
  Administered 2020-12-01 (×2): 50 ug via INTRAVENOUS

## 2020-12-01 MED ORDER — SODIUM CHLORIDE 0.9 % IV SOLN: INTRAVENOUS | Status: DC

## 2020-12-01 MED ORDER — FENTANYL CITRATE (PF) 100 MCG/2ML IJ SOLN
INTRAMUSCULAR | Status: AC
  Filled 2020-12-01: qty 2

## 2020-12-01 MED ORDER — LIDOCAINE HCL (CARDIAC) PF 100 MG/5ML IV SOSY
PREFILLED_SYRINGE | INTRAVENOUS | Status: DC | PRN
  Administered 2020-12-01: 50 mg via INTRAVENOUS

## 2020-12-01 MED ORDER — PROPOFOL 500 MG/50ML IV EMUL
INTRAVENOUS | Status: AC
  Filled 2020-12-01: qty 50

## 2020-12-01 MED ORDER — PROPOFOL 10 MG/ML IV BOLUS
INTRAVENOUS | Status: DC | PRN
  Administered 2020-12-01: 70 mg via INTRAVENOUS

## 2020-12-01 NOTE — H&P (Signed)
Bryan Bellows, MD [address], [place], [place], [place], [address] [address], [place], [place], [place], [address] Phone: [phone]  Fax: [phone]  Primary Care Physician:  Delsa Grana, PA-C   Pre-Procedure History & Physical: HPI:  [name] is a 50 y.o. male is here for an endoscopy and colonoscopy    Past Medical History:  Diagnosis Date  . Depression    pt. does not recall ever being diagnosed with depression, neither being on medications.   . Hypertension     Past Surgical History:  Procedure Laterality Date  . UPPER GI ENDOSCOPY Bilateral 2013    Prior to Admission medications   Medication Sig Start Date End Date Taking? Authorizing Provider  atorvastatin (LIPITOR) 20 MG tablet Take 1 tablet (20 mg total) by mouth daily. 05/15/20  Yes Delsa Grana, PA-C  omeprazole (PRILOSEC) 40 MG capsule Take 1 capsule (40 mg total) by mouth daily. 10/18/20  Yes Bryan Bellows, MD  tamsulosin (FLOMAX) 0.4 MG CAPS capsule TAKE 1 CAPSULE BY MOUTH EVERYDAY AT BEDTIME 08/14/20  Yes Delsa Grana, PA-C    Allergies as of 10/18/2020 - Review Complete 10/18/2020  Allergen Reaction Noted  . Lisinopril  09/22/2017    Family History  Problem Relation Age of Onset  . Diabetes Mother   . Cancer Father        Stomach  . Kidney disease Father   . Hypertension Father     Social History   Socioeconomic History  . Marital status: Divorced    Spouse name: Not on file  . Number of children: 3  . Years of education: Not on file  . Highest education level: 12th grade  Occupational History  . Occupation: truck Geophysicist/field seismologist   Tobacco Use  . Smoking status: Former Smoker    Packs/day: 0.75    Years: 22.00    Pack years: 16.50  . Smokeless tobacco: Never Used  . Tobacco comment: patient stated that he quit 2010  Vaping Use  . Vaping Use: Never used  Substance and Sexual Activity  . Alcohol use: No    Alcohol/week: 0.0 standard drinks  . Drug use: No  . Sexual activity:  Yes    Partners: Female    Birth control/protection: None  Other Topics Concern  . 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   Social Determinants of Health   Financial Resource Strain: Not on file  Food Insecurity: Not on file  Transportation Needs: Not on file  Physical Activity: Not on file  Stress: Not on file  Social Connections: Not on file  Intimate Partner Violence: Not on file    Review of Systems: See HPI, otherwise negative ROS  Physical Exam: BP (!) 160/109   Pulse 68   Temp (!) 96.5 F (35.8 C) (Temporal)   Resp 18   Ht 5\' 7"  (1.702 m)   Wt 86.2 kg   SpO2 100%   BMI 29.76 kg/m  General:   Alert,  pleasant and cooperative in NAD Head:  Normocephalic and atraumatic. Neck:  Supple; no masses or thyromegaly. Lungs:  Clear throughout to auscultation, normal respiratory effort.    Heart:  +S1, +S2, Regular rate and rhythm, No edema. Abdomen:  Soft, nontender and nondistended. Normal bowel sounds, without guarding, and without rebound.   Neurologic:  Alert and  oriented x4;  grossly normal neurologically.  Impression/Plan:  [name] is here for an endoscopy and colonoscopy  to be performed for  evaluation of dysphagia and colon cancer screening     Risks, benefits, limitations, and alternatives regarding endoscopy have been reviewed with the patient.  Questions have been answered.  All parties agreeable.   Bryan Bellows, MD  12/01/2020, 9:36 AM

## 2020-12-01 NOTE — Transfer of Care (Signed)
Immediate Anesthesia Transfer of Care Note  Patient: Bryan Green  Procedure(s) Performed: COLONOSCOPY WITH PROPOFOL (N/A ) ESOPHAGOGASTRODUODENOSCOPY (EGD) WITH PROPOFOL (N/A )  Patient Location: PACU  Anesthesia Type:General  Level of Consciousness: awake, alert  and oriented  Airway & Oxygen Therapy: Patient Spontanous Breathing and Patient connected to nasal cannula oxygen  Post-op Assessment: Report given to RN and Post -op Vital signs reviewed and stable  Post vital signs: Reviewed and stable  Last Vitals:  Vitals Value Taken Time  BP    Temp    Pulse    Resp    SpO2      Last Pain:  Vitals:   12/01/20 0913  TempSrc: Temporal  PainSc: 0-No pain         Complications: No complications documented.

## 2020-12-01 NOTE — Op Note (Signed)
[facility] Gastroenterology Patient Name: Bryan Green Procedure Date: 12/01/2020 9:57 AM MRN: [id number] Account #: [id number] Date of Birth: Mar 05, 1971 Admit Type: Outpatient Age: 50 Room: [facility] ENDO ROOM 4 Gender: Male Note Status: Finalized Procedure:             Colonoscopy Indications:           Screening for colorectal malignant neoplasm Providers:             Wyline Mood MD, MD Referring MD:          Danelle Berry (Referring MD) Medicines:             Monitored Anesthesia Care Complications:         No immediate complications. Procedure:             Pre-Anesthesia Assessment:                        - Prior to the procedure, a History and Physical was                         performed, and patient medications, allergies and                         sensitivities were reviewed. The patient's tolerance                         of previous anesthesia was reviewed.                        - The risks and benefits of the procedure and the                         sedation options and risks were discussed with the                         patient. All questions were answered and informed                         consent was obtained.                        - ASA Grade Assessment: II - A patient with mild                         systemic disease.                        After obtaining informed consent, the colonoscope was                         passed under direct vision. Throughout the procedure,                         the patient's blood pressure, pulse, and oxygen                         saturations were monitored continuously. The                         Colonoscope was introduced through the anus and  advanced to the the cecum, identified by the                         appendiceal orifice. The colonoscopy was performed                         with ease. The patient tolerated the procedure well.                         The quality of the bowel  preparation was excellent. Findings:      The perianal and digital rectal examinations were normal.      Multiple small-mouthed diverticula were found in the sigmoid colon.      The exam was otherwise without abnormality on direct and retroflexion       views. Impression:            - Diverticulosis in the sigmoid colon.                        - The examination was otherwise normal on direct and                         retroflexion views.                        - No specimens collected. Recommendation:        - Discharge patient to home (with escort).                        - Resume previous diet.                        - Continue present medications.                        - Repeat colonoscopy in 10 years for screening                         purposes. Procedure Code(s):     --- Professional ---                        [phone], Colonoscopy, flexible; diagnostic, including                         collection of specimen(s) by brushing or washing, when                         performed (separate procedure) Diagnosis Code(s):     --- Professional ---                        Z12.11, Encounter for screening for malignant neoplasm                         of colon                        K57.30, Diverticulosis of large intestine without                         perforation or abscess without bleeding  CPT copyright 2019 American Medical Association. All rights reserved. The codes documented in this report are preliminary and upon coder review may  be revised to meet current compliance requirements. Jonathon Bellows, MD Jonathon Bellows MD, MD 12/01/2020 10:31:05 AM This report has been signed electronically. Number of Addenda: 0 Note Initiated On: 12/01/2020 9:57 AM Scope Withdrawal Time: 0 hours 11 minutes 10 seconds  Total Procedure Duration: 0 hours 15 minutes 5 seconds  Estimated Blood Loss:  Estimated blood loss: none.      [facility]

## 2020-12-01 NOTE — Anesthesia Preprocedure Evaluation (Signed)
Anesthesia Evaluation  Patient identified by MRN, date of birth, ID band Patient awake    Reviewed: Allergy & Precautions, NPO status , Patient's Chart, lab work & pertinent test results  History of Anesthesia Complications Negative for: history of anesthetic complications  Airway Mallampati: III  TM Distance: >3 FB Neck ROM: Full    Dental no notable dental hx. (+) Teeth Intact   Pulmonary neg pulmonary ROS, neg sleep apnea, neg COPD, Patient abstained from smoking.Not current smoker, former smoker,    Pulmonary exam normal breath sounds clear to auscultation       Cardiovascular Exercise Tolerance: Good METShypertension, (-) CAD and (-) Past MI (-) dysrhythmias  Rhythm:Regular Rate:Normal - Systolic murmurs    Neuro/Psych PSYCHIATRIC DISORDERS Depression negative neurological ROS     GI/Hepatic GERD  Medicated,(+)     (-) substance abuse  ,   Endo/Other  neg diabetes  Renal/GU negative Renal ROS     Musculoskeletal   Abdominal   Peds  Hematology   Anesthesia Other Findings Past Medical History: No date: Depression     Comment:  pt. does not recall ever being diagnosed with               depression, neither being on medications.  No date: Hypertension  Reproductive/Obstetrics                             Anesthesia Physical Anesthesia Plan  ASA: II  Anesthesia Plan: General   Post-op Pain Management:    Induction: Intravenous  PONV Risk Score and Plan: 2 and Ondansetron, Propofol infusion and TIVA  Airway Management Planned: Nasal Cannula  Additional Equipment: None  Intra-op Plan:   Post-operative Plan:   Informed Consent: I have reviewed the patients History and Physical, chart, labs and discussed the procedure including the risks, benefits and alternatives for the proposed anesthesia with the patient or authorized representative who has indicated his/her understanding  and acceptance.     Dental advisory given  Plan Discussed with: CRNA and Surgeon  Anesthesia Plan Comments: (Discussed risks of anesthesia with patient, including possibility of difficulty with spontaneous ventilation under anesthesia necessitating airway intervention, PONV, and rare risks such as cardiac or respiratory or neurological events. Patient understands.)        Anesthesia Quick Evaluation

## 2020-12-01 NOTE — Op Note (Signed)
[facility] Gastroenterology Patient Name: Bryan Green Procedure Date: 12/01/2020 9:58 AM MRN: [id number] Account #: [id number] Date of Birth: 07/04/1971 Admit Type: Outpatient Age: 50 Room: [facility] ENDO ROOM 4 Gender: Male Note Status: Finalized Procedure:             Upper GI endoscopy Indications:           Dysphagia Providers:             Jonathon Bellows MD, MD Referring MD:          Delsa Grana (Referring MD) Medicines:             Monitored Anesthesia Care Complications:         No immediate complications. Procedure:             Pre-Anesthesia Assessment:                        - Prior to the procedure, a History and Physical was                         performed, and patient medications, allergies and                         sensitivities were reviewed. The patient's tolerance                         of previous anesthesia was reviewed.                        - The risks and benefits of the procedure and the                         sedation options and risks were discussed with the                         patient. All questions were answered and informed                         consent was obtained.                        - ASA Grade Assessment: II - A patient with mild                         systemic disease.                        After obtaining informed consent, the endoscope was                         passed under direct vision. Throughout the procedure,                         the patient's blood pressure, pulse, and oxygen                         saturations were monitored continuously. The Endoscope                         was introduced through the mouth, and advanced to the  third part of duodenum. The upper GI endoscopy was                         accomplished with ease. The patient tolerated the                         procedure well. Findings:      The examined esophagus was normal. Biopsies were taken with a cold        forceps for histology.      The entire examined stomach was normal.      The cardia and gastric fundus were normal on retroflexion.      A single 4 mm sessile polyp with no bleeding was found in the third       portion of the duodenum. The polyp was removed with a cold biopsy       forceps. Resection and retrieval were complete. Impression:            - Normal esophagus. Biopsied.                        - Normal stomach.                        - A single duodenal polyp. Resected and retrieved. Recommendation:        - Await pathology results.                        - Perform a colonoscopy today. Procedure Code(s):     --- Professional ---                        [phone], Esophagogastroduodenoscopy, flexible,                         transoral; with biopsy, single or multiple Diagnosis Code(s):     --- Professional ---                        K31.7, Polyp of stomach and duodenum                        R13.10, Dysphagia, unspecified CPT copyright 2019 American Medical Association. All rights reserved. The codes documented in this report are preliminary and upon coder review may  be revised to meet current compliance requirements. Jonathon Bellows, MD Jonathon Bellows MD, MD 12/01/2020 10:11:44 AM This report has been signed electronically. Number of Addenda: 0 Note Initiated On: 12/01/2020 9:58 AM Estimated Blood Loss:  Estimated blood loss: none.      [facility]

## 2020-12-01 NOTE — Anesthesia Postprocedure Evaluation (Signed)
Anesthesia Post Note  Patient: Bryan Green  Procedure(s) Performed: COLONOSCOPY WITH PROPOFOL (N/A ) ESOPHAGOGASTRODUODENOSCOPY (EGD) WITH PROPOFOL (N/A )  Patient location during evaluation: Endoscopy Anesthesia Type: General Level of consciousness: awake and alert Pain management: pain level controlled Vital Signs Assessment: post-procedure vital signs reviewed and stable Respiratory status: spontaneous breathing, nonlabored ventilation, respiratory function stable and patient connected to nasal cannula oxygen Cardiovascular status: blood pressure returned to baseline and stable Postop Assessment: no apparent nausea or vomiting Anesthetic complications: no   No complications documented.   Last Vitals:  Vitals:   12/01/20 1053 12/01/20 1103  BP: 121/82 (!) 127/91  Pulse: 64 63  Resp: 16 13  Temp:    SpO2: 99% 98%    Last Pain:  Vitals:   12/01/20 1103  TempSrc:   PainSc: 0-No pain                 Arita Miss

## 2020-12-04 ENCOUNTER — Encounter: Payer: Self-pay | Admitting: Gastroenterology

## 2020-12-05 LAB — SURGICAL PATHOLOGY

## 2020-12-25 ENCOUNTER — Telehealth: Payer: Self-pay

## 2020-12-25 NOTE — Telephone Encounter (Signed)
Called patient to inform him of results. LVM to call office back.

## 2020-12-25 NOTE — Telephone Encounter (Signed)
Patient called back and he was informed of the results and Dr. Irene Pap comments/recommendation. Pt verbalized understanding.

## 2020-12-25 NOTE — Telephone Encounter (Signed)
-----   Message from Jonathon Bellows, MD sent at 12/24/2020  7:26 PM EST ----- Inform   1. Duodenal polyp was benign  2. Esophageal biopsies showed some eosinophils which can be related to a food allergy related condition causing dysphagia. Suggest office visit to discuss further and next steps  Bonnye Fava, PA-C    Dr Jonathon Bellows MD,MRCP [facility]) Gastroenterology/Hepatology Pager: [phone]

## 2020-12-28 ENCOUNTER — Telehealth: Payer: Self-pay | Admitting: Gastroenterology

## 2020-12-28 NOTE — Telephone Encounter (Signed)
Called patient to schedule follow up with Dr Vicente Males. LM on patient's VM to call us back .

## 2021-02-20 ENCOUNTER — Other Ambulatory Visit: Payer: Self-pay | Admitting: Family Medicine

## 2021-04-19 ENCOUNTER — Ambulatory Visit: Payer: No Typology Code available for payment source | Admitting: Family Medicine

## 2021-05-03 ENCOUNTER — Other Ambulatory Visit: Payer: Self-pay | Admitting: Gastroenterology

## 2021-05-15 ENCOUNTER — Encounter: Payer: No Typology Code available for payment source | Admitting: Family Medicine

## 2021-05-26 ENCOUNTER — Other Ambulatory Visit: Payer: Self-pay | Admitting: Family Medicine

## 2021-05-26 DIAGNOSIS — E7800 Pure hypercholesterolemia, unspecified: Secondary | ICD-10-CM

## 2021-06-06 ENCOUNTER — Other Ambulatory Visit: Payer: Self-pay

## 2021-06-06 ENCOUNTER — Encounter: Payer: Self-pay | Admitting: Family Medicine

## 2021-06-06 ENCOUNTER — Ambulatory Visit (INDEPENDENT_AMBULATORY_CARE_PROVIDER_SITE_OTHER): Payer: No Typology Code available for payment source | Admitting: Family Medicine

## 2021-06-06 VITALS — BP 132/84 | HR 80 | Temp 98.2°F | Resp 18 | Ht 67.0 in | Wt 182.7 lb

## 2021-06-06 DIAGNOSIS — N400 Enlarged prostate without lower urinary tract symptoms: Secondary | ICD-10-CM | POA: Insufficient documentation

## 2021-06-06 DIAGNOSIS — Z125 Encounter for screening for malignant neoplasm of prostate: Secondary | ICD-10-CM | POA: Diagnosis not present

## 2021-06-06 DIAGNOSIS — E7800 Pure hypercholesterolemia, unspecified: Secondary | ICD-10-CM | POA: Diagnosis not present

## 2021-06-06 DIAGNOSIS — E663 Overweight: Secondary | ICD-10-CM | POA: Diagnosis not present

## 2021-06-06 DIAGNOSIS — N401 Enlarged prostate with lower urinary tract symptoms: Secondary | ICD-10-CM | POA: Insufficient documentation

## 2021-06-06 DIAGNOSIS — I10 Essential (primary) hypertension: Secondary | ICD-10-CM

## 2021-06-06 DIAGNOSIS — M79644 Pain in right finger(s): Secondary | ICD-10-CM

## 2021-06-06 DIAGNOSIS — Z23 Encounter for immunization: Secondary | ICD-10-CM

## 2021-06-06 DIAGNOSIS — R3914 Feeling of incomplete bladder emptying: Secondary | ICD-10-CM

## 2021-06-06 DIAGNOSIS — K219 Gastro-esophageal reflux disease without esophagitis: Secondary | ICD-10-CM

## 2021-06-06 DIAGNOSIS — M79646 Pain in unspecified finger(s): Secondary | ICD-10-CM | POA: Insufficient documentation

## 2021-06-06 LAB — BASIC METABOLIC PANEL
BUN: 20 mg/dL (ref 7–25)
CO2: 29 mmol/L (ref 20–32)
Calcium: 9.7 mg/dL (ref 8.6–10.3)
Chloride: 103 mmol/L (ref 98–110)
Creat: 1.13 mg/dL (ref 0.70–1.30)
Glucose, Bld: 95 mg/dL (ref 65–99)
Potassium: 4.3 mmol/L (ref 3.5–5.3)
Sodium: 140 mmol/L (ref 135–146)

## 2021-06-06 LAB — PSA: PSA: 1.07 ng/mL (ref <=4.00)

## 2021-06-06 NOTE — Assessment & Plan Note (Signed)
-   Recheck lipids today

## 2021-06-06 NOTE — Assessment & Plan Note (Signed)
2/2 slamming finger in door. No findings to support fracture or infection on exam or bedside POCUS. Recommend NSAIDs for pain relief. Return and emergency precautions reviewed.

## 2021-06-06 NOTE — Assessment & Plan Note (Signed)
Doing well on PPI, encouraged f/u with GI due to EGD results.

## 2021-06-06 NOTE — Assessment & Plan Note (Signed)
Symptomatic, AUA score 23/35 today. Recommend daily use of flomax. If sx still present at f/u, recommend increase dose.

## 2021-06-06 NOTE — Patient Instructions (Addendum)
It was great to see you!  Our plans for today:  - Call GI for an appointment.  - Check with the pharmacy about getting your COVID booster and Shingles vaccines. - Take 600mg  ibuprofen for finger pain and swelling.  - Take tamsulosin every day to help with your urine stream.   We are checking some labs today, we will release these results to your MyChart.  Take care and seek immediate care sooner if you develop any concerns.   Dr. Ky Barban   Preventive Care 51-34 Years Old, Male Preventive care refers to lifestyle choices and visits with your health care provider that can promote health and wellness. This includes: A yearly physical exam. This is also called an annual wellness visit. Regular dental and eye exams. Immunizations. Screening for certain conditions. Healthy lifestyle choices, such as: Eating a healthy diet. Getting regular exercise. Not using drugs or products that contain nicotine and tobacco. Limiting alcohol use. What can I expect for my preventive care visit? Physical exam Your health care provider will check your: Height and weight. These may be used to calculate your BMI (body mass index). BMI is a measurement that tells if you are at a healthy weight. Heart rate and blood pressure. Body temperature. Skin for abnormal spots. Counseling Your health care provider may ask you questions about your: Past medical problems. Family's medical history. Alcohol, tobacco, and drug use. Emotional well-being. Home life and relationship well-being. Sexual activity. Diet, exercise, and sleep habits. Work and work Statistician. Access to firearms. What immunizations do I need?  Vaccines are usually given at various ages, according to a schedule. Your health care provider will recommend vaccines for you based on your age, medicalhistory, and lifestyle or other factors, such as travel or where you work. What tests do I need? Blood tests Lipid and cholesterol levels. These  may be checked every 5 years, or more often if you are over 46 years old. Hepatitis C test. Hepatitis B test. Screening Lung cancer screening. You may have this screening every year starting at age 51 if you have a 30-pack-year history of smoking and currently smoke or have quit within the past 15 years. Prostate cancer screening. Recommendations will vary depending on your family history and other risks. Genital exam to check for testicular cancer or hernias. Colorectal cancer screening. All adults should have this screening starting at age 51 and continuing until age 70. Your health care provider may recommend screening at age 51 if you are at increased risk. You will have tests every 1-10 years, depending on your results and the type of screening test. Diabetes screening. This is done by checking your blood sugar (glucose) after you have not eaten for a while (fasting). You may have this done every 1-3 years. STD (sexually transmitted disease) testing, if you are at risk. Follow these instructions at home: Eating and drinking  Eat a diet that includes fresh fruits and vegetables, whole grains, lean protein, and low-fat dairy products. Take vitamin and mineral supplements as recommended by your health care provider. Do not drink alcohol if your health care provider tells you not to drink. If you drink alcohol: Limit how much you have to 0-2 drinks a day. Be aware of how much alcohol is in your drink. In the [place] one drink equals one 12 oz bottle of beer (355 mL), one 5 oz glass of wine (148 mL), or one 1 oz glass of hard liquor (44 mL).  Lifestyle Take daily care of your  teeth and gums. Brush your teeth every morning and night with fluoride toothpaste. Floss one time each day. Stay active. Exercise for at least 30 minutes 5 or more days each week. Do not use any products that contain nicotine or tobacco, such as cigarettes, e-cigarettes, and chewing tobacco. If you need help quitting,  ask your health care provider. Do not use drugs. If you are sexually active, practice safe sex. Use a condom or other form of protection to prevent STIs (sexually transmitted infections). If told by your health care provider, take low-dose aspirin daily starting at age 43. Find healthy ways to cope with stress, such as: Meditation, yoga, or listening to music. Journaling. Talking to a trusted person. Spending time with friends and family. Safety Always wear your seat belt while driving or riding in a vehicle. Do not drive: If you have been drinking alcohol. Do not ride with someone who has been drinking. When you are tired or distracted. While texting. Wear a helmet and other protective equipment during sports activities. If you have firearms in your house, make sure you follow all gun safety procedures. What's next? Go to your health care provider once a year for an annual wellness visit. Ask your health care provider how often you should have your eyes and teeth checked. Stay up to date on all vaccines. This information is not intended to replace advice given to you by your health care provider. Make sure you discuss any questions you have with your healthcare provider. Document Revised: 08/03/2019 Document Reviewed: 10/29/2018 Elsevier Patient Education  2022 [employer].

## 2021-06-06 NOTE — Progress Notes (Signed)
//  BP 132/84   Pulse 80   Temp 98.2 F (36.8 C) (Oral)   Resp 18   Ht 5\' 7"  (1.702 m)   Wt 182 lb 11.2 oz (82.9 kg)   SpO2 98%   BMI 28.61 kg/m    Subjective:    Patient ID: Bryan Green, male    DOB: 03-12-1971, 50 y.o.   MRN: [id number]  HPI: Bryan Green is a 50 y.o. male presenting on 06/06/2021 for comprehensive medical examination. Current medical complaints include: finger pain  Hypertension: - Medications: none - Compliance: n/a - Checking BP at home: occasionally. - Denies any SOB, CP, vision changes, LE edema, medication SEs  HLD - medications: lipitor 20mg  - compliance: good - medication SEs: none  GERD - Meds: omeprazole 40mg  daily (takes mostly every day) - Symptoms:  none currently, occasionally will feel pressure at throat with swallowing. - Denies chest pain, choking on food, difficulty swallowing, and dysphagia, early satiety. denies dysphagia has not lost weight denies melena, hematochezia, hematemesis, and coffee ground emesis.  - Previous treatment: proton pump inhibitors. - recent EGD 12/2020 with biopsies with eosinophils, c/f food allergy related. Following with GI, doesn't have f/u appt   Benign Prostatic Hypertrophy - Medications: flomax (usually 2-3x per week) - Symptoms: frequency, incomplete emptying, nocturia one time a night, straining, and weak stream  - Denies: flank pain, gross hematuria, kidney stones, and recurrent UTI - no personal history and no family history of prostate cancer - AUA Symptom Score is, 23/35   IPSS Questionnaire (AUA-7): Over the past month.   1)  How often have you had a sensation of not emptying your bladder completely after you finish urinating?  5 - Almost always  2)  How often have you had to urinate again less than two hours after you finished urinating? 5 - Almost always  3)  How often have you found you stopped and started again several times when you urinated?  5 - Almost always  4) How difficult have  you found it to postpone urination?  2 - Less than half the time  5) How often have you had a weak urinary stream?  3 - About half the time  6) How often have you had to push or strain to begin urination?  2 - Less than half the time  7) How many times did you most typically get up to urinate from the time you went to bed until the time you got up in the morning?  1 - 1 time  Total score:  0-7 mildly symptomatic   8-19 moderately symptomatic   20-35 severely symptomatic   Finger pain - slammed finger in door a few weeks ago. Took ibuprofen with some relief. No fevers, progressive swelling, tracking.   He currently lives with: girlfriend  Depression Screen done today and results listed below:  Depression screen [facility] 2/9 06/06/2021 05/12/2020 12/10/2019 06/21/2019 12/14/2018  Decreased Interest 0 0 3 0 0  Down, Depressed, Hopeless 0 0 0 0 0  PHQ - 2 Score 0 0 3 0 0  Altered sleeping 0 0 0 0 0  Tired, decreased energy 0 0 0 0 0  Change in appetite 0 0 0 0 0  Feeling bad or failure about yourself  0 0 0 0 0  Trouble concentrating 0 0 0 0 0  Moving slowly or fidgety/restless 0 0 0 0 0  Suicidal thoughts 0 0 0 0 0  PHQ-9 Score 0 0  3 0 0  Difficult doing work/chores Not difficult at all Not difficult at all Not difficult at all Not difficult at all Not difficult at all    The patient does not have a history of falls. I did not complete a risk assessment for falls. A plan of care for falls was not documented.   Past Medical History:  Past Medical History:  Diagnosis Date   Depression    pt. does not recall ever being diagnosed with depression, neither being on medications.    Hypertension     Surgical History:  Past Surgical History:  Procedure Laterality Date   COLONOSCOPY WITH PROPOFOL N/A 12/01/2020   Procedure: COLONOSCOPY WITH PROPOFOL;  Surgeon: Jonathon Bellows, MD;  Location: [facility] ENDOSCOPY;  Service: Gastroenterology;  Laterality: N/A;   ESOPHAGOGASTRODUODENOSCOPY (EGD) WITH PROPOFOL  N/A 12/01/2020   Procedure: ESOPHAGOGASTRODUODENOSCOPY (EGD) WITH PROPOFOL;  Surgeon: Jonathon Bellows, MD;  Location: [facility] ENDOSCOPY;  Service: Gastroenterology;  Laterality: N/A;   UPPER GI ENDOSCOPY Bilateral 2013    Medications:  Current Outpatient Medications on File Prior to Visit  Medication Sig   atorvastatin (LIPITOR) 20 MG tablet TAKE 1 TABLET BY MOUTH EVERY DAY   omeprazole (PRILOSEC) 40 MG capsule Take 1 capsule (40 mg total) by mouth daily.   tamsulosin (FLOMAX) 0.4 MG CAPS capsule TAKE 1 CAPSULE BY MOUTH EVERYDAY AT BEDTIME   No current facility-administered medications on file prior to visit.    Allergies:  Allergies  Allergen Reactions   Lisinopril     Dizziness, headache and drowsy     Social History:  Social History   Socioeconomic History   Marital status: Divorced    Spouse name: Not on file   Number of children: 3   Years of education: Not on file   Highest education level: 12th grade  Occupational History   Occupation: truck driver   Tobacco Use   Smoking status: Former    Packs/day: 0.75    Years: 22.00    Pack years: 16.50    Types: Cigarettes    Quit date: 2010    Years since quitting: 12.5   Smokeless tobacco: Never   Tobacco comments:    patient stated that he quit 2010  Vaping Use   Vaping Use: Never used  Substance and Sexual Activity   Alcohol use: No    Alcohol/week: 0.0 standard drinks   Drug use: No   Sexual activity: Yes    Partners: Female    Birth control/protection: None  Other Topics Concern   Not on file  Social History Narrative   He is a Administrator, 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   Social Determinants of Health   Financial Resource Strain: Low Risk    Difficulty of Paying Living Expenses: Not hard at all  Food Insecurity: No Food Insecurity   Worried About Charity fundraiser in the Last Year: Never true   [place] in the Last Year: Never true  Transportation Needs: No Transportation  Needs   Lack of Transportation (Medical): No   Lack of Transportation (Non-Medical): No  Physical Activity: Not on file  Stress: No Stress Concern Present   Feeling of Stress : Only a little  Social Connections: Moderately Isolated   Frequency of Communication with Friends and Family: Once a week   Frequency of Social Gatherings with Friends and Family: Once a week   Attends Religious Services: 1 to 4 times per year  Active Member of Clubs or Organizations: Yes   Attends Archivist Meetings: 1 to 4 times per year   Marital Status: Divorced  Human resources officer Violence: Not At Risk   Fear of Current or Ex-Partner: No   Emotionally Abused: No   Physically Abused: No   Sexually Abused: No   Social History   Tobacco Use  Smoking Status Former   Packs/day: 0.75   Years: 22.00   Pack years: 16.50   Types: Cigarettes   Quit date: 2010   Years since quitting: 12.5  Smokeless Tobacco Never  Tobacco Comments   patient stated that he quit 2010   Social History   Substance and Sexual Activity  Alcohol Use No   Alcohol/week: 0.0 standard drinks    Family History:  Family History  Problem Relation Age of Onset   Diabetes Mother    Cancer Father        Stomach   Kidney disease Father    Hypertension Father     Past medical history, surgical history, medications, allergies, family history and social history reviewed with patient today and changes made to appropriate areas of the chart.      Objective:    BP 132/84   Pulse 80   Temp 98.2 F (36.8 C) (Oral)   Resp 18   Ht 5\' 7"  (1.702 m)   Wt 182 lb 11.2 oz (82.9 kg)   SpO2 98%   BMI 28.61 kg/m   Wt Readings from Last 3 Encounters:  06/06/21 182 lb 11.2 oz (82.9 kg)  12/01/20 190 lb (86.2 kg)  10/18/20 192 lb (87.1 kg)    Physical Exam Constitutional:      Appearance: Normal appearance.  HENT:     Head: Normocephalic.     Right Ear: External ear normal.     Left Ear: External ear normal.  Eyes:      Extraocular Movements: Extraocular movements intact.  Cardiovascular:     Rate and Rhythm: Normal rate.  Pulmonary:     Effort: Pulmonary effort is normal. No respiratory distress.  Musculoskeletal:        General: Normal range of motion.     Comments: Swelling and erythema to R distal middle finger, TTP. No fluctuance, tracking, crepitus appreciated.   Skin:    General: Skin is warm and dry.  Neurological:     Mental Status: He is alert. Mental status is at baseline.  Psychiatric:        Mood and Affect: Mood normal.        Behavior: Behavior normal.    Results for orders placed or performed during the hospital encounter of 12/01/20  Surgical pathology  Result Value Ref Range   SURGICAL PATHOLOGY      SURGICAL PATHOLOGY CASE: [id number] PATIENT: Dion Body Surgical Pathology Report     Specimen Submitted: A. Esophagus; cbx B. Duodenum polyp; cbx  Clinical History: Dysphagia R13.10, colon cancer screening Z12.11. Findings: Duodenal polyp, diverticulosis.     DIAGNOSIS: A. ESOPHAGUS; COLD BIOPSY: - STRATIFIED SQUAMOUS EPITHELIUM WITH VERY FOCAL MILD EOSINOPHILIA, MAXIMUM NUMBER 12 EOSINOPHILS PER HIGH POWER FIELD. - FEATURES ARE NOT DIAGNOSTIC; CLINICAL CORRELATION IS RECOMMENDED. - NEGATIVE FOR DYSPLASIA AND MALIGNANCY.  B. DUODENAL POLYP; COLD BIOPSY: - LYMPHANGIOMA. - NEGATIVE FOR DYSPLASIA AND MALIGNANCY.  GROSS DESCRIPTION: A. Labeled: cbx esophagus rule out EOE Received: Formalin Collection time: 10:05 AM on 12/01/2020 Placed into formalin time: 10:05 AM on 12/01/2020 Tissue fragment(s): Multiple Size: Aggregate, 0.9 x 0.6 x  0.2 cm Description: Tan-White translucent soft tissue fragments Entirely submitted in 1 cassette.  B. Labeled: cbx  duodenal polyp Received: Formalin Collection time: 10:06 AM on 12/01/2020 Placed into formalin time: 10:06 AM on 12/01/2020 Tissue fragment(s): 2 Size: Range from 0.3-0.4 cm Description: Tan soft tissue  fragments Entirely submitted in 1 cassette.  Final Diagnosis performed by Bryan Lemma, MD.   Electronically signed 12/05/2020 5:30:13PM The electronic signature indicates that the named Attending Pathologist has evaluated the specimen Technical component performed at [facility] [address] [place], [place] [address] Lab: [phone] Dir: Rush Farmer, MD, MMM  Professional component performed at [facility], [facility], [place], [place], [place] [address] Lab: [phone] Dir: Dellia Nims. Reuel Derby, MD       Assessment & Plan:   Problem List Items Addressed This Visit       Cardiovascular and Mediastinum   Hypertension - Primary    At goal for age, no changes today. Obtaining BMP.       Relevant Orders   Basic Metabolic Panel (BMET)     Digestive   GERD (gastroesophageal reflux disease)    Doing well on PPI, encouraged f/u with GI due to EGD results.         Genitourinary   BPH (benign prostatic hyperplasia)    Symptomatic, AUA score 23/35 today. Recommend daily use of flomax. If sx still present at f/u, recommend increase dose.          Other   Hyperlipidemia    Recheck lipids today       Relevant Orders   Lipid panel   Finger pain    2/2 slamming finger in door. No findings to support fracture or infection on exam or bedside POCUS. Recommend NSAIDs for pain relief. Return and emergency precautions reviewed.       Other Visit Diagnoses     Overweight (BMI 25.0-29.9)       Relevant Orders   Hemoglobin A1c   Screening for prostate cancer       Relevant Orders   PSA   Need for Tdap vaccination       Relevant Orders   Tdap vaccine greater than or equal to 7yo IM (Completed)        Discussed aspirin prophylaxis for myocardial infarction prevention and decision was it was not indicated  LABORATORY TESTING:  Health maintenance labs ordered today as discussed above.   The natural history of prostate cancer and ongoing  controversy regarding screening and potential treatment outcomes of prostate cancer has been discussed with the patient. The meaning of a false positive PSA and a false negative PSA has been discussed. He indicates understanding of the limitations of this screening test and wishes  to proceed with screening PSA testing.   IMMUNIZATIONS:   - Tdap: Tetanus vaccination status reviewed: due, received today. - Influenza: Postponed to flu season - Pneumovax: Not applicable - Prevnar: Not applicable - HPV: Not applicable - Shingrix vaccine:  due - COVID vaccine: has received 2 doses of Moderna vaccine.  SCREENING: - Colonoscopy: Up to date  Discussed with patient purpose of the colonoscopy is to detect colon cancer at curable precancerous or early stages   - AAA Screening: Not applicable  - Lung cancer screening: n/a  PATIENT COUNSELING:    Sexuality: Discussed sexually transmitted diseases, partner selection, use of condoms, avoidance of unintended pregnancy  and contraceptive alternatives.   Advised to avoid cigarette smoking.  I discussed with the patient that most people  either abstain from alcohol or drink within safe limits (<=14/week and <=4 drinks/occasion for males, <=7/weeks and <= 3 drinks/occasion for females) and that the risk for alcohol disorders and other health effects rises proportionally with the number of drinks per week and how often a drinker exceeds daily limits.  Discussed cessation/primary prevention of drug use and availability of treatment for abuse.   Diet: Encouraged to adjust caloric intake to maintain  or achieve ideal body weight, to reduce intake of dietary saturated fat and total fat, to limit sodium intake by avoiding high sodium foods and not adding table salt, and to maintain adequate dietary potassium and calcium preferably from fresh fruits, vegetables, and low-fat dairy products.    Stressed the importance of regular exercise.  Injury prevention:  Discussed safety belts, safety helmets, smoke detector, smoking near bedding or upholstery.   Dental health: Discussed importance of regular tooth brushing, flossing, and dental visits.   Follow up plan: NEXT PREVENTATIVE PHYSICAL DUE IN 1 YEAR. Return in about 6 months (around 12/07/2021) for bph, HTN.

## 2021-06-06 NOTE — Assessment & Plan Note (Signed)
At goal for age, no changes today. Obtaining BMP.

## 2021-06-10 ENCOUNTER — Other Ambulatory Visit: Payer: Self-pay | Admitting: Family Medicine

## 2021-06-10 DIAGNOSIS — E7800 Pure hypercholesterolemia, unspecified: Secondary | ICD-10-CM

## 2021-12-07 ENCOUNTER — Ambulatory Visit: Payer: No Typology Code available for payment source | Admitting: Family Medicine

## 2021-12-12 ENCOUNTER — Ambulatory Visit (INDEPENDENT_AMBULATORY_CARE_PROVIDER_SITE_OTHER): Payer: BC Managed Care – PPO | Admitting: Nurse Practitioner

## 2021-12-12 ENCOUNTER — Encounter: Payer: Self-pay | Admitting: Nurse Practitioner

## 2021-12-12 VITALS — BP 132/78 | HR 78 | Temp 97.8°F | Resp 18 | Ht 67.0 in | Wt 185.2 lb

## 2021-12-12 DIAGNOSIS — I10 Essential (primary) hypertension: Secondary | ICD-10-CM | POA: Diagnosis not present

## 2021-12-12 DIAGNOSIS — E7800 Pure hypercholesterolemia, unspecified: Secondary | ICD-10-CM | POA: Diagnosis not present

## 2021-12-12 DIAGNOSIS — K219 Gastro-esophageal reflux disease without esophagitis: Secondary | ICD-10-CM

## 2021-12-12 DIAGNOSIS — N401 Enlarged prostate with lower urinary tract symptoms: Secondary | ICD-10-CM

## 2021-12-12 DIAGNOSIS — Z131 Encounter for screening for diabetes mellitus: Secondary | ICD-10-CM

## 2021-12-12 DIAGNOSIS — R3914 Feeling of incomplete bladder emptying: Secondary | ICD-10-CM

## 2021-12-12 MED ORDER — OMEPRAZOLE 40 MG PO CPDR: 40.0000 mg | DELAYED_RELEASE_CAPSULE | Freq: Every day | ORAL | 1 refills | Status: DC

## 2021-12-12 MED ORDER — ATORVASTATIN CALCIUM 20 MG PO TABS: 20.0000 mg | ORAL_TABLET | Freq: Every day | ORAL | 1 refills | Status: DC

## 2021-12-12 MED ORDER — TAMSULOSIN HCL 0.4 MG PO CAPS: ORAL_CAPSULE | ORAL | 1 refills | Status: DC

## 2021-12-12 NOTE — Progress Notes (Signed)
BP 132/78    Pulse 78    Temp 97.8 F (36.6 C)    Resp 18    Ht 5\' 7"  (1.702 m)    Wt 185 lb 3.2 oz (84 kg)    SpO2 99%    BMI 29.01 kg/m    Subjective:    Patient ID: Bryan Green, male    DOB: 23-May-1971, 51 y.o.   MRN: [id number]  HPI: Bryan Green is a 51 y.o. male, here alone  Chief Complaint  Patient presents with   Follow-up    6 month follow    HTN: Currently not on medications. He does not check his blood pressure at home. 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  His blood pressure today is 132/78.  He denies any chest pain, shortness of breath, headaches or blurred vision.  Discussed getting a blood pressure cuff and monitoring blood pressure a couple times a week and keeping a log.  Will continue to monitor.  GERD: He has been out of his omeprazole for awhile.  He says that he has noticed that he has had a few episodes of acid reflux.  Will refill medication. Discussed avoiding food triggers.   BPH: He says he has been out of his flomax. Flomax helps with his symptoms.   His symptoms include; frequency, incomplete emptying, nocturia, staring and weak stream.  He denies any flank pain, hematuria, or recurrent UTIs.  Will send in refill of flomax.  Hypercholesterolemia: He has been out of his cholesterol medication for awhile.  His last LDL was 74 on 05/12/2020.  Will get labs.   Relevant past medical, surgical, family and social history reviewed and updated as indicated. Interim medical history since our last visit reviewed. Allergies and medications reviewed and updated.  Review of Systems  Constitutional: Negative for fever or weight change.  Respiratory: Negative for cough and shortness of breath.   Cardiovascular: Negative for chest pain or palpitations.  Gastrointestinal: Negative for abdominal pain, no bowel changes.  Musculoskeletal: Negative for gait problem or joint swelling.  Skin: Negative for rash.   Neurological: Negative for dizziness or headache.  No other specific complaints in a complete review of systems (except as listed in HPI above).      Objective:    BP 132/78    Pulse 78    Temp 97.8 F (36.6 C)    Resp 18    Ht 5\' 7"  (1.702 m)    Wt 185 lb 3.2 oz (84 kg)    SpO2 99%    BMI 29.01 kg/m   Wt Readings from Last 3 Encounters:  12/12/21 185 lb 3.2 oz (84 kg)  06/06/21 182 lb 11.2 oz (82.9 kg)  12/01/20 190 lb (86.2 kg)    Physical Exam  Constitutional: Patient appears well-developed and well-nourished. Overweight  No distress.  HEENT: head atraumatic, normocephalic, pupils equal and reactive to light,  neck supple Cardiovascular: Normal rate, regular rhythm and normal heart sounds.  No murmur heard. No BLE edema. Pulmonary/Chest: Effort normal and breath sounds normal. No respiratory distress. Abdominal: Soft.  There is no tenderness. Psychiatric: Patient has a normal mood and affect. behavior is normal. Judgment and thought content normal.   Results for orders placed or performed in visit on 99/83/38  Basic Metabolic Panel (BMET)  Result Value Ref Range   Glucose, Bld 95 65 - 99 mg/dL   BUN 20 7 -  25 mg/dL   Creat 1.13 0.70 - 1.30 mg/dL   BUN/Creatinine Ratio NOT APPLICABLE 6 - 22 (calc)   Sodium 140 135 - 146 mmol/L   Potassium 4.3 3.5 - 5.3 mmol/L   Chloride 103 98 - 110 mmol/L   CO2 29 20 - 32 mmol/L   Calcium 9.7 8.6 - 10.3 mg/dL  PSA  Result Value Ref Range   PSA 1.07 < OR = 4.00 ng/mL      Assessment & Plan:   1. Primary hypertension  - CBC with Differential/Platelet - COMPLETE METABOLIC PANEL WITH GFR  2. Gastroesophageal reflux disease, unspecified whether esophagitis present  - omeprazole (PRILOSEC) 40 MG capsule; Take 1 capsule (40 mg total) by mouth daily.  Dispense: 90 capsule; Refill: 1  3. Benign prostatic hyperplasia with incomplete bladder emptying  - tamsulosin (FLOMAX) 0.4 MG CAPS capsule; TAKE 1 CAPSULE BY MOUTH EVERYDAY AT BEDTIME   Dispense: 90 capsule; Refill: 1  4. Pure hypercholesterolemia  - COMPLETE METABOLIC PANEL WITH GFR - Lipid panel - atorvastatin (LIPITOR) 20 MG tablet; Take 1 tablet (20 mg total) by mouth daily.  Dispense: 90 tablet; Refill: 1  5. Screening for diabetes mellitus  - COMPLETE METABOLIC PANEL WITH GFR - Hemoglobin A1c   Follow up plan: Return in about 6 months (around 06/11/2022) for follow up.

## 2021-12-13 LAB — COMPLETE METABOLIC PANEL WITH GFR
AG Ratio: 1.6 (calc) (ref 1.0–2.5)
ALT: 24 U/L (ref 9–46)
AST: 21 U/L (ref 10–35)
Albumin: 4.4 g/dL (ref 3.6–5.1)
Alkaline phosphatase (APISO): 60 U/L (ref 35–144)
BUN: 9 mg/dL (ref 7–25)
CO2: 33 mmol/L — ABNORMAL HIGH (ref 20–32)
Calcium: 10.1 mg/dL (ref 8.6–10.3)
Chloride: 100 mmol/L (ref 98–110)
Creat: 1.15 mg/dL (ref 0.70–1.30)
Globulin: 2.8 g/dL (calc) (ref 1.9–3.7)
Glucose, Bld: 86 mg/dL (ref 65–99)
Potassium: 4.3 mmol/L (ref 3.5–5.3)
Sodium: 140 mmol/L (ref 135–146)
Total Bilirubin: 0.5 mg/dL (ref 0.2–1.2)
Total Protein: 7.2 g/dL (ref 6.1–8.1)
eGFR: 77 mL/min/{1.73_m2} (ref >=60)

## 2021-12-13 LAB — CBC WITH DIFFERENTIAL/PLATELET
Absolute Monocytes: 503 cells/uL (ref 200–950)
Basophils Absolute: 61 cells/uL (ref 0–200)
Basophils Relative: 1.3 %
Eosinophils Absolute: 240 cells/uL (ref 15–500)
Eosinophils Relative: 5.1 %
HCT: 43 % (ref 38.5–50.0)
Hemoglobin: 14.1 g/dL (ref 13.2–17.1)
Lymphs Abs: 1659 cells/uL (ref 850–3900)
MCH: 27.5 pg (ref 27.0–33.0)
MCHC: 32.8 g/dL (ref 32.0–36.0)
MCV: 83.8 fL (ref 80.0–100.0)
MPV: 11.3 fL (ref 7.5–12.5)
Monocytes Relative: 10.7 %
Neutro Abs: 2237 cells/uL (ref 1500–7800)
Neutrophils Relative %: 47.6 %
Platelets: 230 10*3/uL (ref 140–400)
RBC: 5.13 10*6/uL (ref 4.20–5.80)
RDW: 13.1 % (ref 11.0–15.0)
Total Lymphocyte: 35.3 %
WBC: 4.7 10*3/uL (ref 3.8–10.8)

## 2021-12-13 LAB — LIPID PANEL
Cholesterol: 234 mg/dL — ABNORMAL HIGH (ref <200)
HDL: 53 mg/dL (ref >=40)
LDL Cholesterol (Calc): 161 mg/dL (calc) — ABNORMAL HIGH
Non-HDL Cholesterol (Calc): 181 mg/dL (calc) — ABNORMAL HIGH (ref <130)
Total CHOL/HDL Ratio: 4.4 (calc) (ref <5.0)
Triglycerides: 91 mg/dL (ref <150)

## 2021-12-13 LAB — HEMOGLOBIN A1C
Hgb A1c MFr Bld: 6.1 % of total Hgb — ABNORMAL HIGH (ref <5.7)
Mean Plasma Glucose: 128 mg/dL
eAG (mmol/L): 7.1 mmol/L

## 2022-06-12 ENCOUNTER — Encounter: Payer: Self-pay | Admitting: Nurse Practitioner

## 2022-06-12 ENCOUNTER — Ambulatory Visit (INDEPENDENT_AMBULATORY_CARE_PROVIDER_SITE_OTHER): Payer: BC Managed Care – PPO | Admitting: Nurse Practitioner

## 2022-06-12 VITALS — BP 128/84 | HR 79 | Temp 97.8°F | Resp 16 | Ht 67.0 in | Wt 186.2 lb

## 2022-06-12 DIAGNOSIS — N401 Enlarged prostate with lower urinary tract symptoms: Secondary | ICD-10-CM

## 2022-06-12 DIAGNOSIS — R7303 Prediabetes: Secondary | ICD-10-CM | POA: Diagnosis not present

## 2022-06-12 DIAGNOSIS — E7800 Pure hypercholesterolemia, unspecified: Secondary | ICD-10-CM | POA: Diagnosis not present

## 2022-06-12 DIAGNOSIS — R3914 Feeling of incomplete bladder emptying: Secondary | ICD-10-CM

## 2022-06-12 DIAGNOSIS — I10 Essential (primary) hypertension: Secondary | ICD-10-CM | POA: Diagnosis not present

## 2022-06-12 DIAGNOSIS — K219 Gastro-esophageal reflux disease without esophagitis: Secondary | ICD-10-CM

## 2022-06-12 MED ORDER — ATORVASTATIN CALCIUM 20 MG PO TABS: 20.0000 mg | ORAL_TABLET | Freq: Every day | ORAL | 1 refills | Status: DC

## 2022-06-12 MED ORDER — CHLORTHALIDONE 25 MG PO TABS: 25.0000 mg | ORAL_TABLET | Freq: Every day | ORAL | 0 refills | Status: DC

## 2022-06-12 MED ORDER — OMEPRAZOLE 40 MG PO CPDR: 40.0000 mg | DELAYED_RELEASE_CAPSULE | Freq: Every day | ORAL | 1 refills | Status: DC

## 2022-06-12 MED ORDER — TAMSULOSIN HCL 0.4 MG PO CAPS: ORAL_CAPSULE | ORAL | 1 refills | Status: DC

## 2022-06-12 NOTE — Assessment & Plan Note (Signed)
Patient states he has been taking his atorvastatin 20 mg daily.  He will continue to do so.  We will get labs today.

## 2022-06-12 NOTE — Assessment & Plan Note (Signed)
Patient states the last 2 months he is really been watching his sugar intake.  We will recheck today

## 2022-06-12 NOTE — Progress Notes (Signed)
BP 128/84   Pulse 79   Temp 97.8 F (36.6 C) (Oral)   Resp 16   Ht _0  (1.702 m)   Wt 186 lb 3.2 oz (84.5 kg)   SpO2 98%   BMI 29.16 kg/m    Subjective:    Patient ID: Bryan Green, male    DOB: 1971-02-08, 51 y.o.   MRN: [id number]  HPI: Bryan Green is a 51 y.o. male  Chief Complaint  Patient presents with   Hyperlipidemia   Hypertension   Gastroesophageal Reflux    6 month follow up   HTN: His blood pressure today is 128/84.  He is not currently taking any medications for his blood pressure.  He denies any chest pain, shortness of breath, headaches or blurred vision.  He says his blood pressure does run elevated at work.  States in the past he has been on chlorthalidone and would like a new prescription for that.  GERD: Patient reports he has been taking omeprazole 40 mg daily.  Patient states this is really helped with his acid reflux.  Does try to avoid food triggers.  BPH: Patient reports that Flomax does help his symptoms.  This if symptoms he does experience are frequency, incomplete emptying, nocturia and weak stream.  His last PSA was 1.07 on 06/06/2021.  We will recheck that today.  We will continue with Flomax 0.4 mg daily.    Hypercholesterolemia: His last LDL was 61 on 12/12/2021.  He currently takes atorvastatin 20 mg daily.  He denies any myalgia.  We will get labs today.  Prediabetes: His last A1c was 6.1 on 12/12/2021.  He says he is really trying to watch his sugars the last few months.  We will recheck today.     06/12/2022    8:02 AM 12/12/2021    8:11 AM 06/06/2021    8:35 AM 05/12/2020    9:51 AM 12/10/2019    8:03 AM  Depression screen PHQ 2/9  Decreased Interest 0 0 0 0 3  Down, Depressed, Hopeless 0 0 0 0 0  PHQ - 2 Score 0 0 0 0 3  Altered sleeping  0 0 0 0  Tired, decreased energy  0 0 0 0  Change in appetite  0 0 0 0  Feeling bad or failure about yourself   0 0 0 0  Trouble concentrating  0 0 0 0  Moving slowly or fidgety/restless  0 0  0 0  Suicidal thoughts  0 0 0 0  PHQ-9 Score  0 0 0 3  Difficult doing work/chores  Not difficult at all Not difficult at all Not difficult at all Not difficult at all    Relevant past medical, surgical, family and social history reviewed and updated as indicated. Interim medical history since our last visit reviewed. Allergies and medications reviewed and updated.  Review of Systems  Constitutional: Negative for fever or weight change.  Respiratory: Negative for cough and shortness of breath.   Cardiovascular: Negative for chest pain or palpitations.  Gastrointestinal: Negative for abdominal pain, no bowel changes.  Musculoskeletal: Negative for gait problem or joint swelling.  Skin: Negative for rash.  Neurological: Negative for dizziness or headache.  No other specific complaints in a complete review of systems (except as listed in HPI above).      Objective:    BP 128/84   Pulse 79   Temp 97.8 F (36.6 C) (Oral)   Resp 16   Ht  _0  (1.702 m)   Wt 186 lb 3.2 oz (84.5 kg)   SpO2 98%   BMI 29.16 kg/m   Wt Readings from Last 3 Encounters:  06/12/22 186 lb 3.2 oz (84.5 kg)  12/12/21 185 lb 3.2 oz (84 kg)  06/06/21 182 lb 11.2 oz (82.9 kg)    Physical Exam  Constitutional: Patient appears well-developed and well-nourished.  No distress.  HEENT: head atraumatic, normocephalic, pupils equal and reactive to light,  neck supple Cardiovascular: Normal rate, regular rhythm and normal heart sounds.  No murmur heard. No BLE edema. Pulmonary/Chest: Effort normal and breath sounds normal. No respiratory distress. Abdominal: Soft.  There is no tenderness. Psychiatric: Patient has a normal mood and affect. behavior is normal. Judgment and thought content normal.  Results for orders placed or performed in visit on 12/12/21  CBC with Differential/Platelet  Result Value Ref Range   WBC 4.7 3.8 - 10.8 Thousand/uL   RBC 5.13 4.20 - 5.80 Million/uL   Hemoglobin 14.1 13.2 - 17.1 g/dL    HCT 43.0 38.5 - 50.0 %   MCV 83.8 80.0 - 100.0 fL   MCH 27.5 27.0 - 33.0 pg   MCHC 32.8 32.0 - 36.0 g/dL   RDW 13.1 11.0 - 15.0 %   Platelets 230 140 - 400 Thousand/uL   MPV 11.3 7.5 - 12.5 fL   Neutro Abs 2,237 1,500 - 7,800 cells/uL   Lymphs Abs 1,659 850 - 3,900 cells/uL   Absolute Monocytes 503 200 - 950 cells/uL   Eosinophils Absolute 240 15 - 500 cells/uL   Basophils Absolute 61 0 - 200 cells/uL   Neutrophils Relative % 47.6 %   Total Lymphocyte 35.3 %   Monocytes Relative 10.7 %   Eosinophils Relative 5.1 %   Basophils Relative 1.3 %  COMPLETE METABOLIC PANEL WITH GFR  Result Value Ref Range   Glucose, Bld 86 65 - 99 mg/dL   BUN 9 7 - 25 mg/dL   Creat 1.15 0.70 - 1.30 mg/dL   eGFR 77 > OR = 60 mL/min/1.33m   BUN/Creatinine Ratio NOT APPLICABLE 6 - 22 (calc)   Sodium 140 135 - 146 mmol/L   Potassium 4.3 3.5 - 5.3 mmol/L   Chloride 100 98 - 110 mmol/L   CO2 33 (H) 20 - 32 mmol/L   Calcium 10.1 8.6 - 10.3 mg/dL   Total Protein 7.2 6.1 - 8.1 g/dL   Albumin 4.4 3.6 - 5.1 g/dL   Globulin 2.8 1.9 - 3.7 g/dL (calc)   AG Ratio 1.6 1.0 - 2.5 (calc)   Total Bilirubin 0.5 0.2 - 1.2 mg/dL   Alkaline phosphatase (APISO) 60 35 - 144 U/L   AST 21 10 - 35 U/L   ALT 24 9 - 46 U/L  Lipid panel  Result Value Ref Range   Cholesterol 234 (H) <200 mg/dL   HDL 53 > OR = 40 mg/dL   Triglycerides 91 <150 mg/dL   LDL Cholesterol (Calc) 161 (H) mg/dL (calc)   Total CHOL/HDL Ratio 4.4 <5.0 (calc)   Non-HDL Cholesterol (Calc) 181 (H) <130 mg/dL (calc)  Hemoglobin A1c  Result Value Ref Range   Hgb A1c MFr Bld 6.1 (H) <5.7 % of total Hgb   Mean Plasma Glucose 128 mg/dL   eAG (mmol/L) 7.1 mmol/L      Assessment & Plan:   Problem List Items Addressed This Visit       Cardiovascular and Mediastinum   Primary hypertension - Primary  Sent in prescription for chlorthalidone 25 mg daily.  Patient has been on this medication in the past.  Patient states his blood pressure tends to be  high while he is at work.      Relevant Medications   chlorthalidone (HYGROTON) 25 MG tablet   atorvastatin (LIPITOR) 20 MG tablet   Other Relevant Orders   CBC with Differential/Platelet   COMPLETE METABOLIC PANEL WITH GFR     Digestive   Gastroesophageal reflux disease    Continue taking omeprazole 40 mg daily.  Continue to avoid food triggers.      Relevant Medications   omeprazole (PRILOSEC) 40 MG capsule     Genitourinary   Benign prostatic hyperplasia with incomplete bladder emptying    Doing well with current treatment of Flomax 0.4 mg daily.  Continue with this treatment plan.      Relevant Medications   tamsulosin (FLOMAX) 0.4 MG CAPS capsule   Other Relevant Orders   PSA     Other   Pure hypercholesterolemia   Relevant Medications   chlorthalidone (HYGROTON) 25 MG tablet   atorvastatin (LIPITOR) 20 MG tablet   Other Relevant Orders   Lipid panel   COMPLETE METABOLIC PANEL WITH GFR   Prediabetes    Patient states the last 2 months he is really been watching his sugar intake.  We will recheck today      Relevant Orders   Hemoglobin A1c     Follow up plan: Return in about 6 months (around 12/13/2022) for cpe.

## 2022-06-12 NOTE — Assessment & Plan Note (Signed)
Doing well with current treatment of Flomax 0.4 mg daily.  Continue with this treatment plan.

## 2022-06-12 NOTE — Assessment & Plan Note (Signed)
Continue taking omeprazole 40 mg daily.  Continue to avoid food triggers.

## 2022-06-12 NOTE — Assessment & Plan Note (Signed)
Sent in prescription for chlorthalidone 25 mg daily.  Patient has been on this medication in the past.  Patient states his blood pressure tends to be high while he is at work.

## 2022-06-13 LAB — PSA: PSA: 0.88 ng/mL (ref <=4.00)

## 2022-06-13 LAB — COMPLETE METABOLIC PANEL WITH GFR
AG Ratio: 2 (calc) (ref 1.0–2.5)
ALT: 31 U/L (ref 9–46)
AST: 33 U/L (ref 10–35)
Albumin: 4.6 g/dL (ref 3.6–5.1)
Alkaline phosphatase (APISO): 56 U/L (ref 35–144)
BUN/Creatinine Ratio: 13 (calc) (ref 6–22)
BUN: 18 mg/dL (ref 7–25)
CO2: 28 mmol/L (ref 20–32)
Calcium: 9.5 mg/dL (ref 8.6–10.3)
Chloride: 104 mmol/L (ref 98–110)
Creat: 1.35 mg/dL — ABNORMAL HIGH (ref 0.70–1.30)
Globulin: 2.3 g/dL (calc) (ref 1.9–3.7)
Glucose, Bld: 93 mg/dL (ref 65–99)
Potassium: 4.1 mmol/L (ref 3.5–5.3)
Sodium: 139 mmol/L (ref 135–146)
Total Bilirubin: 0.4 mg/dL (ref 0.2–1.2)
Total Protein: 6.9 g/dL (ref 6.1–8.1)
eGFR: 63 mL/min/{1.73_m2} (ref >=60)

## 2022-06-13 LAB — CBC WITH DIFFERENTIAL/PLATELET
Absolute Monocytes: 502 cells/uL (ref 200–950)
Basophils Absolute: 40 cells/uL (ref 0–200)
Basophils Relative: 0.9 %
Eosinophils Absolute: 229 cells/uL (ref 15–500)
Eosinophils Relative: 5.2 %
HCT: 40 % (ref 38.5–50.0)
Hemoglobin: 13.4 g/dL (ref 13.2–17.1)
Lymphs Abs: 1342 cells/uL (ref 850–3900)
MCH: 28 pg (ref 27.0–33.0)
MCHC: 33.5 g/dL (ref 32.0–36.0)
MCV: 83.7 fL (ref 80.0–100.0)
MPV: 11.1 fL (ref 7.5–12.5)
Monocytes Relative: 11.4 %
Neutro Abs: 2288 cells/uL (ref 1500–7800)
Neutrophils Relative %: 52 %
Platelets: 211 10*3/uL (ref 140–400)
RBC: 4.78 10*6/uL (ref 4.20–5.80)
RDW: 12.8 % (ref 11.0–15.0)
Total Lymphocyte: 30.5 %
WBC: 4.4 10*3/uL (ref 3.8–10.8)

## 2022-06-13 LAB — LIPID PANEL
Cholesterol: 139 mg/dL (ref <200)
HDL: 54 mg/dL (ref >=40)
LDL Cholesterol (Calc): 72 mg/dL (calc)
Non-HDL Cholesterol (Calc): 85 mg/dL (calc) (ref <130)
Total CHOL/HDL Ratio: 2.6 (calc) (ref <5.0)
Triglycerides: 58 mg/dL (ref <150)

## 2022-06-13 LAB — HEMOGLOBIN A1C
Hgb A1c MFr Bld: 5.8 % of total Hgb — ABNORMAL HIGH (ref <5.7)
Mean Plasma Glucose: 120 mg/dL
eAG (mmol/L): 6.6 mmol/L

## 2022-07-05 ENCOUNTER — Other Ambulatory Visit: Payer: Self-pay | Admitting: Nurse Practitioner

## 2022-07-05 DIAGNOSIS — I10 Essential (primary) hypertension: Secondary | ICD-10-CM

## 2022-07-05 NOTE — Telephone Encounter (Signed)
Requested Prescriptions  Pending Prescriptions Disp Refills  . chlorthalidone (HYGROTON) 25 MG tablet [Pharmacy Med Name: CHLORTHALIDONE 25 MG TABLET] 30 tablet 0    Sig: TAKE 1 TABLET (25 MG TOTAL) BY MOUTH DAILY.     Cardiovascular: Diuretics - Thiazide Failed - 07/05/2022 11:30 AM      Failed - Cr in normal range and within 180 days    Creat  Date Value Ref Range Status  06/12/2022 1.35 (H) 0.70 - 1.30 mg/dL Final         Passed - K in normal range and within 180 days    Potassium  Date Value Ref Range Status  06/12/2022 4.1 3.5 - 5.3 mmol/L Final         Passed - Na in normal range and within 180 days    Sodium  Date Value Ref Range Status  06/12/2022 139 135 - 146 mmol/L Final         Passed - Last BP in normal range    BP Readings from Last 1 Encounters:  06/12/22 128/84         Passed - Valid encounter within last 6 months    Recent Outpatient Visits          3 weeks ago Primary hypertension   [facility] Bo Merino, FNP   6 months ago Primary hypertension   [facility] Bo Merino, FNP   1 year ago Primary hypertension   [facility] Myles Gip, DO   2 years ago Adult general medical exam   [facility] Delsa Grana, PA-C   2 years ago Essential hypertension   [facility] Delsa Grana, PA-C      Future Appointments            In 5 months Delsa Grana, PA-C [facility], [facility]

## 2022-12-13 ENCOUNTER — Encounter: Payer: BC Managed Care – PPO | Admitting: Family Medicine

## 2022-12-26 ENCOUNTER — Encounter: Payer: BC Managed Care – PPO | Admitting: Internal Medicine

## 2022-12-31 ENCOUNTER — Other Ambulatory Visit: Payer: Self-pay | Admitting: Nurse Practitioner

## 2022-12-31 DIAGNOSIS — K219 Gastro-esophageal reflux disease without esophagitis: Secondary | ICD-10-CM

## 2022-12-31 DIAGNOSIS — E7800 Pure hypercholesterolemia, unspecified: Secondary | ICD-10-CM

## 2022-12-31 DIAGNOSIS — N401 Enlarged prostate with lower urinary tract symptoms: Secondary | ICD-10-CM

## 2022-12-31 NOTE — Telephone Encounter (Signed)
Future appt in 4 weeks.  Requested Prescriptions  Pending Prescriptions Disp Refills   omeprazole (PRILOSEC) 40 MG capsule [Pharmacy Med Name: OMEPRAZOLE DR 40 MG CAPSULE] 90 capsule 1    Sig: TAKE 1 CAPSULE (40 MG TOTAL) BY MOUTH DAILY.     Gastroenterology: Proton Pump Inhibitors Passed - 12/31/2022  2:20 AM      Passed - Valid encounter within last 12 months    Recent Outpatient Visits           6 months ago Primary hypertension   [facility], Julie F, FNP   1 year ago Primary hypertension   [facility] Bo Merino, FNP   1 year ago Primary hypertension   [facility] Myles Gip, DO   2 years ago Adult general medical exam   [facility] Delsa Grana, PA-C   3 years ago Essential hypertension   [facility] Delsa Grana, PA-C       Future Appointments             In 4 weeks Teodora Medici, [facility]             atorvastatin (LIPITOR) 20 MG tablet [Pharmacy Med Name: ATORVASTATIN 20 MG TABLET] 90 tablet 1    Sig: TAKE 1 TABLET BY MOUTH EVERY DAY     Cardiovascular:  Antilipid - Statins Failed - 12/31/2022  2:20 AM      Failed - Lipid Panel in normal range within the last 12 months    Cholesterol  Date Value Ref Range Status  06/12/2022 139 <200 mg/dL Final   LDL Cholesterol (Calc)  Date Value Ref Range Status  06/12/2022 72 mg/dL (calc) Final    Comment:    Reference range: <100 . Desirable range <100 mg/dL for primary prevention;   <70 mg/dL for patients with CHD or diabetic patients  with > or = 2 CHD risk factors. Marland Kitchen LDL-C is now calculated using the Martin-Hopkins  calculation, which is a validated novel method providing  better accuracy than the Friedewald equation in the  estimation of LDL-C.  Cresenciano Genre et al. Annamaria Helling. [id number]): 2061-2068   (http://education.[facility].com/faq/FAQ164)    HDL  Date Value Ref Range Status  06/12/2022 54 > OR = 40 mg/dL Final   Triglycerides  Date Value Ref Range Status  06/12/2022 58 <150 mg/dL Final         Passed - Patient is not pregnant      Passed - Valid encounter within last 12 months    Recent Outpatient Visits           6 months ago Primary hypertension   [facility] Bo Merino, FNP   1 year ago Primary hypertension   [facility] Bo Merino, FNP   1 year ago Primary hypertension   [facility] Myles Gip, DO   2 years ago Adult general medical exam   [facility] Delsa Grana, PA-C   3 years ago Essential hypertension   [facility] Delsa Grana, [place]       Future Appointments             In 4 weeks Teodora Medici, [facility], [facility]             tamsulosin [facility])  0.4 MG CAPS capsule [Pharmacy Med Name: TAMSULOSIN HCL 0.4 MG CAPSULE] 90 capsule 1    Sig: TAKE 1 CAPSULE BY MOUTH EVERYDAY AT BEDTIME     Urology: Alpha-Adrenergic Blocker Passed - 12/31/2022  2:20 AM      Passed - PSA in normal range and within 360 days    PSA  Date Value Ref Range Status  06/12/2022 0.88 < OR = 4.00 ng/mL Final    Comment:    The total PSA value from this assay system is  standardized against the WHO standard. The test  result will be approximately 20% lower when compared  to the equimolar-standardized total PSA (Beckman  Coulter). Comparison of serial PSA results should be  interpreted with this fact in mind. . This test was performed using the Siemens  chemiluminescent method. Values obtained from  different assay methods cannot be used interchangeably. PSA levels, regardless of value, should not be interpreted as absolute evidence of the presence or absence of disease.           Passed - Last BP in normal range    BP Readings from Last 1 Encounters:  06/12/22 128/84         Passed - Valid encounter within last 12 months    Recent Outpatient Visits           6 months ago Primary hypertension   [facility] Bo Merino, FNP   1 year ago Primary hypertension   [facility] Bo Merino, FNP   1 year ago Primary hypertension   [facility] Myles Gip, DO   2 years ago Adult general medical exam   [facility] Delsa Grana, PA-C   3 years ago Essential hypertension   [facility] Delsa Grana, PA-C       Future Appointments             In 4 weeks Teodora Medici, [facility], [facility]

## 2023-01-27 NOTE — Progress Notes (Deleted)
Name: Bryan Green   MRN: [id number]    DOB: Mar 27, 1971   Date:01/27/2023       Progress Note  Subjective  Chief Complaint  No chief complaint on file.   HPI  Patient presents for annual CPE ***.  IPSS Questionnaire (AUA-7): Over the past month.   1)  How often have you had a sensation of not emptying your bladder completely after you finish urinating?  {Rating:19227}  2)  How often have you had to urinate again less than two hours after you finished urinating? {Rating:19227}  3)  How often have you found you stopped and started again several times when you urinated?  {Rating:19227}  4) How difficult have you found it to postpone urination?  {Rating:19227}  5) How often have you had a weak urinary stream?  {Rating:19227}  6) How often have you had to push or strain to begin urination?  {Rating:19227}  7) How many times did you most typically get up to urinate from the time you went to bed until the time you got up in the morning?  {Rating:19228}  Total score:  0-7 mildly symptomatic   8-19 moderately symptomatic   20-35 severely symptomatic     Diet: *** Exercise: *** Last Dental Exam: **** Last Eye Exam: ***  Depression: phq 9 is {gen pos neg:315643}    06/12/2022    8:02 AM 12/12/2021    8:11 AM 06/06/2021    8:35 AM 05/12/2020    9:51 AM 12/10/2019    8:03 AM  Depression screen PHQ 2/9  Decreased Interest 0 0 0 0 3  Down, Depressed, Hopeless 0 0 0 0 0  PHQ - 2 Score 0 0 0 0 3  Altered sleeping  0 0 0 0  Tired, decreased energy  0 0 0 0  Change in appetite  0 0 0 0  Feeling bad or failure about yourself   0 0 0 0  Trouble concentrating  0 0 0 0  Moving slowly or fidgety/restless  0 0 0 0  Suicidal thoughts  0 0 0 0  PHQ-9 Score  0 0 0 3  Difficult doing work/chores  Not difficult at all Not difficult at all Not difficult at all Not difficult at all    Hypertension:  BP Readings from Last 3 Encounters:  06/12/22 128/84  12/12/21 132/78  06/06/21 132/84     Obesity: Wt Readings from Last 3 Encounters:  06/12/22 186 lb 3.2 oz (84.5 kg)  12/12/21 185 lb 3.2 oz (84 kg)  06/06/21 182 lb 11.2 oz (82.9 kg)   BMI Readings from Last 3 Encounters:  06/12/22 29.16 kg/m  12/12/21 29.01 kg/m  06/06/21 28.61 kg/m     Lipids:  Lab Results  Component Value Date   CHOL 139 06/12/2022   CHOL 234 (H) 12/12/2021   CHOL 135 05/12/2020   Lab Results  Component Value Date   HDL 54 06/12/2022   HDL 53 12/12/2021   HDL 50 05/12/2020   Lab Results  Component Value Date   LDLCALC 72 06/12/2022   LDLCALC 161 (H) 12/12/2021   LDLCALC 74 05/12/2020   Lab Results  Component Value Date   TRIG 58 06/12/2022   TRIG 91 12/12/2021   TRIG 40 05/12/2020   Lab Results  Component Value Date   CHOLHDL 2.6 06/12/2022   CHOLHDL 4.4 12/12/2021   CHOLHDL 2.7 05/12/2020   No results found for: "LDLDIRECT" Glucose:  Glucose, Bld  Date Value Ref  Range Status  06/12/2022 93 65 - 99 mg/dL Final    Comment:    .            Fasting reference interval .   12/12/2021 86 65 - 99 mg/dL Final    Comment:    .            Fasting reference interval .   06/06/2021 95 65 - 99 mg/dL Final    Comment:    .            Fasting reference interval .     Amanda Office Visit from 06/12/2022 in [facility]  AUDIT-C Score 1      ***  Divorced STD testing and prevention (HIV/chl/gon/syphilis):  {yes/no/default [id number] applicable"} Sexual history:  Hep C Screening: 2020 Skin cancer: Discussed monitoring for atypical lesions Colorectal cancer: colonoscopy 11/2020 - repeat in 10 years Prostate cancer:  yes Lab Results  Component Value Date   PSA 0.88 06/12/2022   PSA 1.07 06/06/2021   PSA 0.9 05/12/2020     Lung cancer:  Low Dose CT Chest recommended if Age 66-80 years, 30 pack-year currently smoking OR have quit w/in 15years. Patient  no a candidate for screening   AAA: The USPSTF recommends one-time  screening with ultrasonography in men ages 41 to 27 years who have ever smoked. Patient   no, a candidate for screening  ECG:  08/02/2019  Vaccines:   Tdap: 2022 Shingrix: Discussed Pneumonia: Discussed  Flu: 2021 COVID-19: 2021  Advanced Care Planning: A voluntary discussion about advance care planning including the explanation and discussion of advance directives.  Discussed health care proxy and Living will, and the patient was able to identify a health care proxy as ***.  Patient {DOES_DOES NF:[id number] have a living will and power of attorney of health care   Patient Active Problem List   Diagnosis Date Noted   Pure hypercholesterolemia 06/12/2022   Prediabetes 06/12/2022   Benign prostatic hyperplasia with incomplete bladder emptying 06/06/2021   Finger pain 06/06/2021   Hyperlipidemia 06/02/2018   Gastroesophageal reflux disease 04/27/2018   Primary hypertension 08/25/2017    Past Surgical History:  Procedure Laterality Date   COLONOSCOPY WITH PROPOFOL N/A 12/01/2020   Procedure: COLONOSCOPY WITH PROPOFOL;  Surgeon: Jonathon Bellows, MD;  Location: [facility] ENDOSCOPY;  Service: Gastroenterology;  Laterality: N/A;   ESOPHAGOGASTRODUODENOSCOPY (EGD) WITH PROPOFOL N/A 12/01/2020   Procedure: ESOPHAGOGASTRODUODENOSCOPY (EGD) WITH PROPOFOL;  Surgeon: Jonathon Bellows, MD;  Location[facility] ENDOSCOPY;  Service: Gastroenterology;  Laterality: N/A;   UPPER GI ENDOSCOPY Bilateral 2013    Family History  Problem Relation Age of Onset   Diabetes Mother    Cancer Father        Stomach   Kidney disease Father    Hypertension Father     Social History   Socioeconomic History   Marital status: Divorced    Spouse name: Not on file   Number of children: 3   Years of education: Not on file   Highest education level: 12th grade  Occupational History   Occupation: truck driver   Tobacco Use   Smoking status: Former    Packs/day: 0.75    Years: 22.00    Total pack years: 16.50    Types:  Cigarettes    Quit date: 2010    Years since quitting: 14.2   Smokeless tobacco: Never   Tobacco comments:    patient stated that he quit 2010  Vaping Use  Vaping Use: Never used  Substance and Sexual Activity   Alcohol use: No    Alcohol/week: 0.0 standard drinks of alcohol   Drug use: No   Sexual activity: Yes    Partners: Female    Birth control/protection: None  Other Topics Concern   Not on file  Social History Narrative   He is a Administrator, 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   Social Determinants of Health   Financial Resource Strain: Low Risk  (06/06/2021)   Overall Financial Resource Strain (CARDIA)    Difficulty of Paying Living Expenses: Not hard at all  Food Insecurity: No Food Insecurity (06/06/2021)   Hunger Vital Sign    Worried About Running Out of Food in the Last Year: Never true    Ran Out of Food in the Last Year: Never true  Transportation Needs: No Transportation Needs (06/06/2021)   PRAPARE - Hydrologist (Medical): No    Lack of Transportation (Non-Medical): No  Physical Activity: Insufficiently Active (09/01/2018)   Exercise Vital Sign    Days of Exercise per Week: 3 days    Minutes of Exercise per Session: 30 min  Stress: No Stress Concern Present (06/06/2021)   [place]    Feeling of Stress : Only a little  Social Connections: Moderately Isolated (06/06/2021)   Social Connection and Isolation Panel [NHANES]    Frequency of Communication with Friends and Family: Once a week    Frequency of Social Gatherings with Friends and Family: Once a week    Attends Religious Services: 1 to 4 times per year    Active Member of [employer] or Organizations: Yes    Attends Archivist Meetings: 1 to 4 times per year    Marital Status: Divorced  Human resources officer Violence: Not At Risk (06/06/2021)   Humiliation, Afraid, Rape, and Kick  questionnaire    Fear of Current or Ex-Partner: No    Emotionally Abused: No    Physically Abused: No    Sexually Abused: No     Current Outpatient Medications:    atorvastatin (LIPITOR) 20 MG tablet, TAKE 1 TABLET BY MOUTH EVERY DAY, Disp: 90 tablet, Rfl: 1   chlorthalidone (HYGROTON) 25 MG tablet, TAKE 1 TABLET (25 MG TOTAL) BY MOUTH DAILY., Disp: 90 tablet, Rfl: 1   omeprazole (PRILOSEC) 40 MG capsule, TAKE 1 CAPSULE (40 MG TOTAL) BY MOUTH DAILY., Disp: 90 capsule, Rfl: 1   tamsulosin (FLOMAX) 0.4 MG CAPS capsule, TAKE 1 CAPSULE BY MOUTH EVERYDAY AT BEDTIME, Disp: 90 capsule, Rfl: 1  Allergies  Allergen Reactions   Lisinopril     Dizziness, headache and drowsy      ROS  ***   Objective  There were no vitals filed for this visit.  There is no height or weight on file to calculate BMI.  Physical Exam ***  No results found for this or any previous visit (from the past 2160 hour(s)).   Fall Risk:    06/12/2022    8:02 AM 12/12/2021    8:10 AM 06/06/2021    8:35 AM 05/12/2020    9:51 AM 12/10/2019    8:03 AM  Fall Risk   Falls in the past year? 0 0 0 0 0  Number falls in past yr: 0 0 0 0 0  Injury with Fall? 0 0 0 0 0  Follow up Falls evaluation completed  Falls  evaluation completed Falls evaluation completed      Functional Status Survey:      Assessment & Plan  There are no diagnoses linked to this encounter.   -Prostate cancer screening and PSA options (with potential risks and benefits of testing vs not testing) were discussed along with recent recs/guidelines. -USPSTF grade A and B recommendations reviewed with patient; age-appropriate recommendations, preventive care, screening tests, etc discussed and encouraged; healthy living encouraged; see AVS for patient education given to patient -Discussed importance of 150 minutes of physical activity weekly, eat two servings of fish weekly, eat one serving of tree nuts ( cashews, pistachios, pecans,  almonds.Marland Kitchen) every other day, eat 6 servings of fruit/vegetables daily and drink plenty of water and avoid sweet beverages.  -Reviewed Health Maintenance: {yes/no/default [id number] applicable"}

## 2023-01-28 ENCOUNTER — Encounter: Payer: BC Managed Care – PPO | Admitting: Internal Medicine

## 2023-02-17 ENCOUNTER — Encounter: Payer: BC Managed Care – PPO | Admitting: Family Medicine

## 2023-03-12 NOTE — Patient Instructions (Signed)

## 2023-03-13 ENCOUNTER — Encounter: Payer: Self-pay | Admitting: Family Medicine

## 2023-03-13 ENCOUNTER — Other Ambulatory Visit (HOSPITAL_COMMUNITY)
Admission: RE | Admit: 2023-03-13 | Discharge: 2023-03-13 | Disposition: A | Discharge status: Home / Self Care | Payer: BC Managed Care – PPO | Source: Ambulatory Visit | Attending: Family Medicine | Admitting: Family Medicine

## 2023-03-13 ENCOUNTER — Ambulatory Visit (INDEPENDENT_AMBULATORY_CARE_PROVIDER_SITE_OTHER): Payer: BC Managed Care – PPO | Admitting: Family Medicine

## 2023-03-13 VITALS — BP 122/76 | HR 72 | Resp 16 | Ht 67.0 in | Wt 181.0 lb

## 2023-03-13 DIAGNOSIS — Z833 Family history of diabetes mellitus: Secondary | ICD-10-CM | POA: Diagnosis not present

## 2023-03-13 DIAGNOSIS — N401 Enlarged prostate with lower urinary tract symptoms: Secondary | ICD-10-CM | POA: Diagnosis not present

## 2023-03-13 DIAGNOSIS — Z0000 Encounter for general adult medical examination without abnormal findings: Secondary | ICD-10-CM

## 2023-03-13 DIAGNOSIS — R3914 Feeling of incomplete bladder emptying: Secondary | ICD-10-CM

## 2023-03-13 DIAGNOSIS — Z113 Encounter for screening for infections with a predominantly sexual mode of transmission: Secondary | ICD-10-CM

## 2023-03-13 DIAGNOSIS — E7800 Pure hypercholesterolemia, unspecified: Secondary | ICD-10-CM | POA: Diagnosis not present

## 2023-03-13 DIAGNOSIS — R7303 Prediabetes: Secondary | ICD-10-CM

## 2023-03-13 LAB — CBC WITH DIFFERENTIAL/PLATELET
Basophils Absolute: 41 cells/uL (ref 0–200)
Basophils Relative: 0.9 %
Lymphs Abs: 1191 cells/uL (ref 850–3900)
MCV: 81 fL (ref 80.0–100.0)
MPV: 11.3 fL (ref 7.5–12.5)
Monocytes Relative: 12.3 %
Platelets: 225 10*3/uL (ref 140–400)
RBC: 5.06 10*6/uL (ref 4.20–5.80)

## 2023-03-13 NOTE — Progress Notes (Signed)
Patient: Bryan Green, Male    DOB: 1971-08-15, 52 y.o.   MRN: [id number] Danelle Berry, PA-C Visit Date: 03/13/2023  Today's Provider: Danelle Berry, PA-C   Chief Complaint  Patient presents with   Annual Exam   Subjective:   Annual physical exam:  Bryan Green is a 52 y.o. male who presents today for health maintenance and annual & complete physical exam.   Exercise/Activity:  goal 3d a week an hour Diet/nutrition:  try to stay away from wrong meats, tried to get fruits  Sleep:  no concerns - sleeps well  He is having a sharp pain that randomly comes and goes to his sides - intermittent, random, brief, can happen once every couple of weeks - no Gisx/N/V, trouble breathing or pleuritic CP, he wonders if its related to his lifting at work or his exercising, not currently symptomatic  SDOH Screenings   Food Insecurity: No Food Insecurity (03/13/2023)  Housing: Low Risk  (03/13/2023)  Transportation Needs: No Transportation Needs (03/13/2023)  Utilities: Not At Risk (03/13/2023)  Alcohol Screen: Low Risk  (06/12/2022)  Depression (PHQ2-9): Low Risk  (03/13/2023)  Financial Resource Strain: Low Risk  (03/13/2023)  Physical Activity: Sufficiently Active (03/13/2023)  Social Connections: Moderately Integrated (03/13/2023)  Stress: No Stress Concern Present (03/13/2023)  Tobacco Use: Medium Risk (03/13/2023)    USPSTF grade A and B recommendations - reviewed and addressed today  Depression:  Phq 9 completed today by patient, was reviewed by me with patient in the room, score is  negative, pt feels good    03/13/2023    8:48 AM 06/12/2022    8:02 AM 12/12/2021    8:11 AM  Depression screen PHQ 2/9  Decreased Interest 0 0 0  Down, Depressed, Hopeless 0 0 0  PHQ - 2 Score 0 0 0  Altered sleeping 0  0  Tired, decreased energy 0  0  Change in appetite 0  0  Feeling bad or failure about yourself  0  0  Trouble concentrating 0  0  Moving slowly or fidgety/restless 0  0  Suicidal  thoughts 0  0  PHQ-9 Score 0  0  Difficult doing work/chores   Not difficult at all    Hep C Screening:  done previously   STD testing and prevention (HIV/chl/gon/syphilis):   one partner   Intimate partner violence: safe, lives with partner/fiance and his mom  Advanced Care Planning:   A voluntary discussion about advance care planning including the explanation and discussion of advance directives.  Discussed health care proxy and Living will, and the patient was able to identify a health care proxy as mother.  Patient does not have a living will at present time. If patient does have living will, I have requested they bring this to the clinic to be scanned in to their chart.  Health Maintenance  Topic Date Due   Zoster Vaccines- Shingrix (1 of 2) Never done   COVID-19 Vaccine (3 - 2023-24 season) 07/19/2022   INFLUENZA VACCINE  06/19/2023   COLONOSCOPY (Pts 45-79yrs Insurance coverage will need to be confirmed)  12/01/2030   DTaP/Tdap/Td (3 - Td or Tdap) 06/07/2031   Hepatitis C Screening  Completed   HIV Screening  Completed   HPV VACCINES  Aged Out    Skin cancer:  Pt reports no hx of skin cancer, suspicious lesions/biopsies in the past.  Colorectal cancer:  colonoscopy is UTD Pt denies  melena, hematochezia, change in BM pattern or  caliber    Prostate cancer:  Prostate cancer screening with PSA: Discussed risks and benefits of PSA testing and provided handout. Pt declined - recently done and neg/normal for age, reviewed today Lab Results  Component Value Date   PSA 0.88 06/12/2022   PSA 1.07 06/06/2021   PSA 0.9 05/12/2020    Urinary Symptoms:  psa recently done, trial of meds, dx a little better than before taking flomax, less nighttime waking   IPSS     Row Name 03/13/23 0944         International Prostate Symptom Score   How often have you had the sensation of not emptying your bladder? About half the time     How often have you had to urinate less than  every two hours? About half the time     How often have you found you stopped and started again several times when you urinated? Not at All     How often have you found it difficult to postpone urination? Not at All     How often have you had a weak urinary stream? Less than 1 in 5 times     How often have you had to strain to start urination? Not at All     How many times did you typically get up at night to urinate? None     Total IPSS Score 7               Lung cancer:   Low Dose CT Chest recommended if Age 65-80 years, 20 pack-year currently smoking OR have quit w/in 15years. Patient does not qualify.   Social History   Tobacco Use   Smoking status: Former    Packs/day: 0.75    Years: 22.00    Additional pack years: 0.00    Total pack years: 16.50    Types: Cigarettes    Quit date: 2010    Years since quitting: 14.3   Smokeless tobacco: Never   Tobacco comments:    patient stated that he quit 2010  Substance Use Topics   Alcohol use: No    Alcohol/week: 0.0 standard drinks of alcohol     Alcohol screening: Flowsheet Row Office Visit from 06/12/2022 in [facility]  AUDIT-C Score 1       AAA: n/a The USPSTF recommends one-time screening with ultrasonography in men ages 68 to 5 years who have ever smoked  ECG:not indicated today  Blood pressure/Hypertension: BP Readings from Last 3 Encounters:  03/13/23 122/76  06/12/22 128/84  12/12/21 132/78   Weight/Obesity: Wt Readings from Last 3 Encounters:  03/13/23 181 lb (82.1 kg)  06/12/22 186 lb 3.2 oz (84.5 kg)  12/12/21 185 lb 3.2 oz (84 kg)   BMI Readings from Last 3 Encounters:  03/13/23 28.35 kg/m  06/12/22 29.16 kg/m  12/12/21 29.01 kg/m    Lipids:  Lab Results  Component Value Date   CHOL 139 06/12/2022   CHOL 234 (H) 12/12/2021   CHOL 135 05/12/2020   Lab Results  Component Value Date   HDL 54 06/12/2022   HDL 53 12/12/2021   HDL 50 05/12/2020   Lab Results   Component Value Date   LDLCALC 72 06/12/2022   LDLCALC 161 (H) 12/12/2021   LDLCALC 74 05/12/2020   Lab Results  Component Value Date   TRIG 58 06/12/2022   TRIG 91 12/12/2021   TRIG 40 05/12/2020   Lab Results  Component Value Date   CHOLHDL  2.6 06/12/2022   CHOLHDL 4.4 12/12/2021   CHOLHDL 2.7 05/12/2020   No results found for: "LDLDIRECT" Based on the results of lipid panel his/her cardiovascular risk factor ( using [facility] )  in the next 10 years is : The 10-year ASCVD risk score (Arnett DK, et al., 2019) is: 8.1%   Values used to calculate the score:     Age: 79 years     Sex: Male     Is Non-Hispanic African American: Yes     Diabetic: No     Tobacco smoker: No     Systolic Blood Pressure: 122 mmHg     Is BP treated: Yes     HDL Cholesterol: 54 mg/dL     Total Cholesterol: 139 mg/dL Glucose:  Glucose, Bld  Date Value Ref Range Status  06/12/2022 93 65 - 99 mg/dL Final    Comment:    .            Fasting reference interval .   12/12/2021 86 65 - 99 mg/dL Final    Comment:    .            Fasting reference interval .   06/06/2021 95 65 - 99 mg/dL Final    Comment:    .            Fasting reference interval .     Social History       Social History   Socioeconomic History   Marital status: Divorced    Spouse name: Not on file   Number of children: 3   Years of education: Not on file   Highest education level: 12th grade  Occupational History   Occupation: truck driver   Tobacco Use   Smoking status: Former    Packs/day: 0.75    Years: 22.00    Additional pack years: 0.00    Total pack years: 16.50    Types: Cigarettes    Quit date: 2010    Years since quitting: 14.3   Smokeless tobacco: Never   Tobacco comments:    patient stated that he quit 2010  Vaping Use   Vaping Use: Never used  Substance and Sexual Activity   Alcohol use: No    Alcohol/week: 0.0 standard drinks of alcohol   Drug use: No   Sexual activity: Yes     Partners: Female    Birth control/protection: None  Other Topics Concern   Not on file  Social History Narrative   He is a Naval architect, 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   Social Determinants of Health   Financial Resource Strain: Low Risk  (03/13/2023)   Overall Financial Resource Strain (CARDIA)    Difficulty of Paying Living Expenses: Not hard at all  Food Insecurity: No Food Insecurity (03/13/2023)   Hunger Vital Sign    Worried About Running Out of Food in the Last Year: Never true    Ran Out of Food in the Last Year: Never true  Transportation Needs: No Transportation Needs (03/13/2023)   PRAPARE - Administrator, Civil Service (Medical): No    Lack of Transportation (Non-Medical): No  Physical Activity: Sufficiently Active (03/13/2023)   Exercise Vital Sign    Days of Exercise per Week: 3 days    Minutes of Exercise per Session: 60 min  Stress: No Stress Concern Present (03/13/2023)   [employer] of Occupational Health - Occupational Stress Questionnaire  Feeling of Stress : Not at all  Social Connections: Moderately Integrated (03/13/2023)   Social Connection and Isolation Panel [NHANES]    Frequency of Communication with Friends and Family: More than three times a week    Frequency of Social Gatherings with Friends and Family: More than three times a week    Attends Religious Services: More than 4 times per year    Active Member of Clubs or Organizations: Yes    Attends Engineer, structural: More than 4 times per year    Marital Status: Never married    Family History        Family History  Problem Relation Age of Onset   Diabetes Mother    Cancer Father        Stomach   Kidney disease Father    Hypertension Father     Patient Active Problem List   Diagnosis Date Noted   Pure hypercholesterolemia 06/12/2022   Prediabetes 06/12/2022   Benign prostatic hyperplasia with incomplete bladder emptying 06/06/2021   Finger  pain 06/06/2021   Hyperlipidemia 06/02/2018   Gastroesophageal reflux disease 04/27/2018   Primary hypertension 08/25/2017    Past Surgical History:  Procedure Laterality Date   COLONOSCOPY WITH PROPOFOL N/A 12/01/2020   Procedure: COLONOSCOPY WITH PROPOFOL;  Surgeon: Wyline Mood, MD;  Location: [facility] ENDOSCOPY;  Service: Gastroenterology;  Laterality: N/A;   ESOPHAGOGASTRODUODENOSCOPY (EGD) WITH PROPOFOL N/A 12/01/2020   Procedure: ESOPHAGOGASTRODUODENOSCOPY (EGD) WITH PROPOFOL;  Surgeon: Wyline Mood, MD;  Location: [facility] ENDOSCOPY;  Service: Gastroenterology;  Laterality: N/A;   UPPER GI ENDOSCOPY Bilateral 2013     Current Outpatient Medications:    atorvastatin (LIPITOR) 20 MG tablet, TAKE 1 TABLET BY MOUTH EVERY DAY, Disp: 90 tablet, Rfl: 1   chlorthalidone (HYGROTON) 25 MG tablet, TAKE 1 TABLET (25 MG TOTAL) BY MOUTH DAILY., Disp: 90 tablet, Rfl: 1   omeprazole (PRILOSEC) 40 MG capsule, TAKE 1 CAPSULE (40 MG TOTAL) BY MOUTH DAILY., Disp: 90 capsule, Rfl: 1   tamsulosin (FLOMAX) 0.4 MG CAPS capsule, TAKE 1 CAPSULE BY MOUTH EVERYDAY AT BEDTIME, Disp: 90 capsule, Rfl: 1  Allergies  Allergen Reactions   Lisinopril     Dizziness, headache and drowsy     Patient Care Team: Danelle Berry, PA-C as PCP - General (Family Medicine)   Chart Review: I personally reviewed active problem list, medication list, allergies, family history, social history, health maintenance, notes from last encounter, lab results, imaging with the patient/caregiver today.   Review of Systems  Constitutional: Negative.   HENT: Negative.    Eyes: Negative.   Respiratory: Negative.    Cardiovascular: Negative.   Gastrointestinal: Negative.   Endocrine: Negative.   Genitourinary: Negative.   Musculoskeletal: Negative.   Skin: Negative.   Allergic/Immunologic: Negative.   Neurological: Negative.   Hematological: Negative.   Psychiatric/Behavioral: Negative.    All other systems reviewed and are  negative.         Objective:   Vitals:  Vitals:   03/13/23 0849  BP: 122/76  Pulse: 72  Resp: 16  SpO2: 96%  Weight: 181 lb (82.1 kg)  Height:  (1.702 m)    Body mass index is 28.35 kg/m.  Physical Exam Vitals and nursing note reviewed.  Constitutional:      General: He is not in acute distress.    Appearance: Normal appearance. He is well-developed. He is not ill-appearing, toxic-appearing or diaphoretic.  HENT:     Head: Normocephalic and atraumatic.  Jaw: No trismus.     Right Ear: Tympanic membrane, ear canal and external ear normal. There is no impacted cerumen.     Left Ear: Tympanic membrane, ear canal and external ear normal. There is no impacted cerumen.     Nose: Congestion present. No mucosal edema or rhinorrhea.     Right Sinus: No maxillary sinus tenderness or frontal sinus tenderness.     Left Sinus: No maxillary sinus tenderness or frontal sinus tenderness.     Mouth/Throat:     Mouth: Mucous membranes are moist.     Pharynx: Oropharynx is clear. Uvula midline. Posterior oropharyngeal erythema present. No oropharyngeal exudate or uvula swelling.  Eyes:     General: Lids are normal. No scleral icterus.       Right eye: No discharge.        Left eye: No discharge.     Conjunctiva/sclera: Conjunctivae normal.  Neck:     Trachea: Trachea and phonation normal. No tracheal deviation.  Cardiovascular:     Rate and Rhythm: Normal rate and regular rhythm.     Pulses: Normal pulses.          Radial pulses are 2+ on the right side and 2+ on the left side.       Posterior tibial pulses are 2+ on the right side and 2+ on the left side.     Heart sounds: Normal heart sounds. No murmur heard.    No friction rub. No gallop.  Pulmonary:     Effort: Pulmonary effort is normal. No respiratory distress.     Breath sounds: Normal breath sounds. No stridor. No wheezing, rhonchi or rales.  Chest:     Chest wall: No tenderness.  Abdominal:     General: Bowel  sounds are normal. There is no distension.     Palpations: Abdomen is soft. There is no mass.     Tenderness: There is no abdominal tenderness. There is no right CVA tenderness, left CVA tenderness, guarding or rebound.     Hernia: No hernia is present.  Musculoskeletal:     Cervical back: Normal range of motion and neck supple. No rigidity.     Right lower leg: No edema.     Left lower leg: No edema.  Lymphadenopathy:     Cervical: No cervical adenopathy.  Skin:    General: Skin is warm and dry.     Capillary Refill: Capillary refill takes less than 2 seconds.     Findings: No rash.  Neurological:     Mental Status: He is alert and oriented to person, place, and time.     Gait: Gait normal.  Psychiatric:        Mood and Affect: Mood normal.        Speech: Speech normal.        Behavior: Behavior normal.      No results found for this or any previous visit (from the past 2160 hour(s)).  Fall Risk:    03/13/2023    8:48 AM 06/12/2022    8:02 AM 12/12/2021    8:10 AM 06/06/2021    8:35 AM 05/12/2020    9:51 AM  Fall Risk   Falls in the past year? 0 0 0 0 0  Number falls in past yr: 0 0 0 0 0  Injury with Fall? 0 0 0 0 0  Risk for fall due to : No Fall Risks      Follow up Falls prevention discussed Falls  evaluation completed  Falls evaluation completed Falls evaluation completed    Functional Status Survey: Is the patient deaf or have difficulty hearing?: No Does the patient have difficulty seeing, even when wearing glasses/contacts?: No Does the patient have difficulty concentrating, remembering, or making decisions?: No Does the patient have difficulty walking or climbing stairs?: No Does the patient have difficulty dressing or bathing?: No Does the patient have difficulty doing errands alone such as visiting a doctor's office or shopping?: No   Assessment & Plan:    CPE completed today  Prostate cancer screening and PSA options (with potential risks and benefits of  testing vs not testing) were discussed along with recent recs/guidelines, shared decision making and handout/information given to pt today  USPSTF grade A and B recommendations reviewed with patient; age-appropriate recommendations, preventive care, screening tests, etc discussed and encouraged; healthy living encouraged; see AVS for patient education given to patient  Discussed importance of 150 minutes of physical activity weekly, AHA exercise recommendations given to pt in AVS/handout  Discussed importance of healthy diet:  eating lean meats and proteins, avoiding trans fats and saturated fats, avoid simple sugars and excessive carbs in diet, eat 6 servings of fruit/vegetables daily and drink plenty of water and avoid sweet beverages.  DASH diet reviewed if pt has HTN  Recommended pt to do annual eye exam and routine dental exams/cleanings  Advance Care planning information and packet discussed and offered today, encouraged pt to discuss with family members/spouse/partner/friends and complete Advanced directive packet and bring copy to office   Reviewed Health Maintenance: Health Maintenance  Topic Date Due   Zoster Vaccines- Shingrix (1 of 2) Never done   COVID-19 Vaccine (3 - 2023-24 season) 07/19/2022   INFLUENZA VACCINE  06/19/2023   COLONOSCOPY (Pts 45-78yrs Insurance coverage will need to be confirmed)  12/01/2030   DTaP/Tdap/Td (3 - Td or Tdap) 06/07/2031   Hepatitis C Screening  Completed   HIV Screening  Completed   HPV VACCINES  Aged Out    Immunizations: Immunization History  Administered Date(s) Administered   Influenza,inj,Quad PF,6+ Mos 08/01/2015, 09/22/2017, 09/01/2018, 12/10/2019   Moderna Sars-Covid-2 Vaccination 04/30/2020, 05/28/2020   Tdap 11/18/2010, 06/06/2021   Vaccines:   Shingrix (DUE): 50-64 yo and ask insurance if covered when patient above 41 yo Pneumonia: educated and discussed with patient. Flu: out of stock -  educated and discussed with  patient.      ICD-10-CM   1. Annual physical exam  Z00.00 COMPLETE METABOLIC PANEL WITH GFR    Hemoglobin A1c    CBC with Differential/Platelet    Urine cytology ancillary only    RPR    HIV Antibody (routine testing w rflx)    Lipid panel    2. Screening examination for STD (sexually transmitted disease)  Z11.3 Urine cytology ancillary only    RPR    HIV Antibody (routine testing w rflx)    3. Family history of diabetes mellitus in mother  Z83.3 Hemoglobin A1c    4. Benign prostatic hyperplasia with incomplete bladder emptying  N40.1    R39.14     5. Pure hypercholesterolemia  E78.00 Lipid panel    6. Prediabetes  R73.03 Hemoglobin A1c      Return in about 6 months (around 09/12/2023) for Routine follow-up HTN, HLD, BPH.     Danelle Berry, PA-C 03/13/23 9:17 AM  [facility]

## 2023-03-14 LAB — CBC WITH DIFFERENTIAL/PLATELET
Absolute Monocytes: 566 cells/uL (ref 200–950)
Eosinophils Absolute: 253 cells/uL (ref 15–500)
Eosinophils Relative: 5.5 %
HCT: 41 % (ref 38.5–50.0)
Hemoglobin: 13.4 g/dL (ref 13.2–17.1)
MCH: 26.5 pg — ABNORMAL LOW (ref 27.0–33.0)
MCHC: 32.7 g/dL (ref 32.0–36.0)
Neutro Abs: 2548 cells/uL (ref 1500–7800)
Neutrophils Relative %: 55.4 %
RDW: 12.8 % (ref 11.0–15.0)
Total Lymphocyte: 25.9 %
WBC: 4.6 10*3/uL (ref 3.8–10.8)

## 2023-03-14 LAB — COMPLETE METABOLIC PANEL WITH GFR
AG Ratio: 1.6 (calc) (ref 1.0–2.5)
ALT: 35 U/L (ref 9–46)
AST: 30 U/L (ref 10–35)
Albumin: 4.5 g/dL (ref 3.6–5.1)
Alkaline phosphatase (APISO): 69 U/L (ref 35–144)
BUN: 18 mg/dL (ref 7–25)
CO2: 29 mmol/L (ref 20–32)
Calcium: 10.6 mg/dL — ABNORMAL HIGH (ref 8.6–10.3)
Chloride: 99 mmol/L (ref 98–110)
Creat: 1.19 mg/dL (ref 0.70–1.30)
Globulin: 2.8 g/dL (calc) (ref 1.9–3.7)
Glucose, Bld: 103 mg/dL — ABNORMAL HIGH (ref 65–99)
Potassium: 3.9 mmol/L (ref 3.5–5.3)
Sodium: 141 mmol/L (ref 135–146)
Total Bilirubin: 0.4 mg/dL (ref 0.2–1.2)
Total Protein: 7.3 g/dL (ref 6.1–8.1)
eGFR: 73 mL/min/{1.73_m2} (ref >=60)

## 2023-03-14 LAB — LIPID PANEL
Cholesterol: 167 mg/dL (ref <200)
HDL: 52 mg/dL (ref >=40)
LDL Cholesterol (Calc): 99 mg/dL (calc)
Non-HDL Cholesterol (Calc): 115 mg/dL (calc) (ref <130)
Total CHOL/HDL Ratio: 3.2 (calc) (ref <5.0)
Triglycerides: 70 mg/dL (ref <150)

## 2023-03-14 LAB — URINE CYTOLOGY ANCILLARY ONLY
Chlamydia: NEGATIVE
Comment: NEGATIVE
Comment: NEGATIVE
Comment: NORMAL
Neisseria Gonorrhea: NEGATIVE
Trichomonas: NEGATIVE

## 2023-03-14 LAB — HIV ANTIBODY (ROUTINE TESTING W REFLEX): HIV 1&2 Ab, 4th Generation: NONREACTIVE

## 2023-03-14 LAB — HEMOGLOBIN A1C
Hgb A1c MFr Bld: 6.7 % of total Hgb — ABNORMAL HIGH (ref <5.7)
Mean Plasma Glucose: 146 mg/dL
eAG (mmol/L): 8.1 mmol/L

## 2023-03-14 LAB — RPR: RPR Ser Ql: NONREACTIVE

## 2023-03-19 ENCOUNTER — Encounter: Payer: Self-pay | Admitting: Family Medicine

## 2023-03-19 DIAGNOSIS — E119 Type 2 diabetes mellitus without complications: Secondary | ICD-10-CM | POA: Insufficient documentation

## 2023-03-21 ENCOUNTER — Telehealth: Payer: Self-pay | Admitting: *Deleted

## 2023-03-21 NOTE — Telephone Encounter (Signed)
Pt given lab results per notes of L. Tapia, PA from 03/19/23 on 03/21/23. Pt verbalized understanding. Pt has high A1c in type 2 diabetes range - please ask him to schedule appt in the next month to discuss blood  work/dx/treatment Calcium was a little elevated - decrease any calcium supplements or Vit D if he happens to be taking any And all other lab work looked good including all urine/STD screenings were negative  Scheduled patient appt 04/30/23.

## 2023-04-01 ENCOUNTER — Other Ambulatory Visit: Payer: Self-pay | Admitting: Family Medicine

## 2023-04-01 DIAGNOSIS — I10 Essential (primary) hypertension: Secondary | ICD-10-CM

## 2023-04-30 ENCOUNTER — Encounter: Payer: Self-pay | Admitting: Physician Assistant

## 2023-04-30 ENCOUNTER — Ambulatory Visit (INDEPENDENT_AMBULATORY_CARE_PROVIDER_SITE_OTHER): Payer: BC Managed Care – PPO | Admitting: Physician Assistant

## 2023-04-30 VITALS — BP 138/84 | HR 78 | Temp 97.7°F | Resp 14 | Ht 67.0 in | Wt 181.5 lb

## 2023-04-30 DIAGNOSIS — E119 Type 2 diabetes mellitus without complications: Secondary | ICD-10-CM

## 2023-04-30 NOTE — Progress Notes (Signed)
Established Patient Office Visit  Name: Bryan Green   MRN: [id number]    DOB: 27-Oct-1971   Date:04/30/2023  Today's Provider: Jacquelin Hawking, MHS, PA-C Introduced myself to the patient as a PA-C and provided education on APPs in clinical practice.         Subjective  Chief Complaint  Chief Complaint  Patient presents with   Follow-up    HPI   New onset T2DM   Most recent A1c is 6.7%  He is not currently taking any medications  Was previously in prediabetic range  He is taking Lipitor already- LDL is 99   Patient baseline knowledge: he reports he knows that people with DM have to take insulin and injections and they have to avoid sugar    Hypercalcemia  Reports he is taking a mens vitamin daily     Patient Active Problem List   Diagnosis Date Noted   New onset type 2 diabetes mellitus (HCC) 03/19/2023   Pure hypercholesterolemia 06/12/2022   Benign prostatic hyperplasia with incomplete bladder emptying 06/06/2021   Finger pain 06/06/2021   Hyperlipidemia 06/02/2018   Gastroesophageal reflux disease 04/27/2018   Primary hypertension 08/25/2017    Past Surgical History:  Procedure Laterality Date   COLONOSCOPY WITH PROPOFOL N/A 12/01/2020   Procedure: COLONOSCOPY WITH PROPOFOL;  Surgeon: Wyline Mood, MD;  Location: [facility] ENDOSCOPY;  Service: Gastroenterology;  Laterality: N/A;   ESOPHAGOGASTRODUODENOSCOPY (EGD) WITH PROPOFOL N/A 12/01/2020   Procedure: ESOPHAGOGASTRODUODENOSCOPY (EGD) WITH PROPOFOL;  Surgeon: Wyline Mood, MD;  Location: [facility] ENDOSCOPY;  Service: Gastroenterology;  Laterality: N/A;   UPPER GI ENDOSCOPY Bilateral 2013    Family History  Problem Relation Age of Onset   Diabetes Mother    Cancer Father        Stomach   Kidney disease Father    Hypertension Father     Social History   Tobacco Use   Smoking status: Former    Packs/day: 0.75    Years: 22.00    Additional pack years: 0.00    Total pack years: 16.50    Types:  Cigarettes    Quit date: 2010    Years since quitting: 14.4   Smokeless tobacco: Never   Tobacco comments:    patient stated that he quit 2010  Substance Use Topics   Alcohol use: No    Alcohol/week: 0.0 standard drinks of alcohol     Current Outpatient Medications:    atorvastatin (LIPITOR) 20 MG tablet, TAKE 1 TABLET BY MOUTH EVERY DAY, Disp: 90 tablet, Rfl: 1   chlorthalidone (HYGROTON) 25 MG tablet, TAKE 1 TABLET (25 MG TOTAL) BY MOUTH DAILY., Disp: 90 tablet, Rfl: 1   omeprazole (PRILOSEC) 40 MG capsule, TAKE 1 CAPSULE (40 MG TOTAL) BY MOUTH DAILY., Disp: 90 capsule, Rfl: 1   tamsulosin (FLOMAX) 0.4 MG CAPS capsule, TAKE 1 CAPSULE BY MOUTH EVERYDAY AT BEDTIME, Disp: 90 capsule, Rfl: 1  Allergies  Allergen Reactions   Lisinopril     Dizziness, headache and drowsy     I personally reviewed active problem list, medication list, allergies, health maintenance, notes from last encounter, lab results with the patient/caregiver today.   Review of Systems  Constitutional:  Negative for chills, fever and weight loss.  Respiratory:  Negative for shortness of breath and wheezing.   Cardiovascular:  Negative for chest pain, palpitations and leg swelling.  Genitourinary:  Negative for frequency.  Musculoskeletal:  Negative for falls.  Neurological:  Negative  for dizziness, tingling, tremors and headaches.  Endo/Heme/Allergies:  Negative for polydipsia.      Objective  Vitals:   04/30/23 0853  BP: 138/84  Pulse: 78  Resp: 14  Temp: 97.7 F (36.5 C)  TempSrc: Oral  SpO2: 96%  Weight: 181 lb 8 oz (82.3 kg)  Height: 5\' 7"  (1.702 m)    Body mass index is 28.43 kg/m.  Physical Exam Vitals reviewed.  Constitutional:      Appearance: Normal appearance.  HENT:     Head: Normocephalic and atraumatic.  Eyes:     Extraocular Movements: Extraocular movements intact.     Conjunctiva/sclera: Conjunctivae normal.  Pulmonary:     Effort: Pulmonary effort is normal.   Musculoskeletal:     Cervical back: Normal range of motion.  Neurological:     General: No focal deficit present.     Mental Status: He is alert and oriented to person, place, and time. Mental status is at baseline.  Psychiatric:        Mood and Affect: Mood normal.        Behavior: Behavior normal.        Thought Content: Thought content normal.        Judgment: Judgment normal.      Recent Results (from the past 2160 hour(s))  Urine cytology ancillary only     Status: None   Collection Time: 03/13/23  9:29 AM  Result Value Ref Range   Neisseria Gonorrhea Negative    Chlamydia Negative    Trichomonas Negative    Comment Normal Reference Range Trichomonas - Negative    Comment Normal Reference Ranger Chlamydia - Negative    Comment      Normal Reference Range Neisseria Gonorrhea - Negative  COMPLETE METABOLIC PANEL WITH GFR     Status: Abnormal   Collection Time: 03/13/23  9:54 AM  Result Value Ref Range   Glucose, Bld 103 (H) 65 - 99 mg/dL    Comment: .            Fasting reference interval . For someone without known diabetes, a glucose value between 100 and 125 mg/dL is consistent with prediabetes and should be confirmed with a follow-up test. .    BUN 18 7 - 25 mg/dL   Creat 1.61 0.96 - 0.45 mg/dL   eGFR 73 > OR = 60 WU/JWJ/1.91Y7   BUN/Creatinine Ratio SEE NOTE: 6 - 22 (calc)    Comment:    Not Reported: BUN and Creatinine are within    reference range. .    Sodium 141 135 - 146 mmol/L   Potassium 3.9 3.5 - 5.3 mmol/L   Chloride 99 98 - 110 mmol/L   CO2 29 20 - 32 mmol/L   Calcium 10.6 (H) 8.6 - 10.3 mg/dL   Total Protein 7.3 6.1 - 8.1 g/dL   Albumin 4.5 3.6 - 5.1 g/dL   Globulin 2.8 1.9 - 3.7 g/dL (calc)   AG Ratio 1.6 1.0 - 2.5 (calc)   Total Bilirubin 0.4 0.2 - 1.2 mg/dL   Alkaline phosphatase (APISO) 69 35 - 144 U/L   AST 30 10 - 35 U/L   ALT 35 9 - 46 U/L  Hemoglobin A1c     Status: Abnormal   Collection Time: 03/13/23  9:54 AM  Result Value  Ref Range   Hgb A1c MFr Bld 6.7 (H) <5.7 % of total Hgb    Comment: For someone without known diabetes, a hemoglobin A1c value of 6.5%  or greater indicates that they may have  diabetes and this should be confirmed with a follow-up  test. . For someone with known diabetes, a value <7% indicates  that their diabetes is well controlled and a value  greater than or equal to 7% indicates suboptimal  control. A1c targets should be individualized based on  duration of diabetes, age, comorbid conditions, and  other considerations. . Currently, no consensus exists regarding use of hemoglobin A1c for diagnosis of diabetes for children. .    Mean Plasma Glucose 146 mg/dL   eAG (mmol/L) 8.1 mmol/L    Comment: . This test was performed on the Roche cobas c503 platform. Effective 08/26/22, a change in test platforms from the Abbott Architect to the Roche cobas c503 may have shifted HbA1c results compared to historical results. Based on laboratory validation testing conducted at [facility], the Roche platform relative to the Abbott platform had an average increase in HbA1c value of < or = 0.3%. This difference is within accepted  variability established by the [facility]. Note that not all individuals will have had a shift in their results and direct comparisons between historical and current results for testing conducted on different platforms is not recommended.   CBC with Differential/Platelet     Status: Abnormal   Collection Time: 03/13/23  9:54 AM  Result Value Ref Range   WBC 4.6 3.8 - 10.8 Thousand/uL   RBC 5.06 4.20 - 5.80 Million/uL   Hemoglobin 13.4 13.2 - 17.1 g/dL   HCT 16.1 09.6 - 04.5 %   MCV 81.0 80.0 - 100.0 fL   MCH 26.5 (L) 27.0 - 33.0 pg   MCHC 32.7 32.0 - 36.0 g/dL   RDW 40.9 81.1 - 91.4 %   Platelets 225 140 - 400 Thousand/uL   MPV 11.3 7.5 - 12.5 fL   Neutro Abs 2,548 1,500 - 7,800 cells/uL   Lymphs Abs 1,191 850 - 3,900  cells/uL   Absolute Monocytes 566 200 - 950 cells/uL   Eosinophils Absolute 253 15 - 500 cells/uL   Basophils Absolute 41 0 - 200 cells/uL   Neutrophils Relative % 55.4 %   Total Lymphocyte 25.9 %   Monocytes Relative 12.3 %   Eosinophils Relative 5.5 %   Basophils Relative 0.9 %  RPR     Status: None   Collection Time: 03/13/23  9:54 AM  Result Value Ref Range   RPR Ser Ql NON-REACTIVE NON-REACTIVE    Comment: . No laboratory evidence of syphilis. If recent exposure is suspected, submit a new sample in 2-4 weeks. Marland Kitchen   HIV Antibody (routine testing w rflx)     Status: None   Collection Time: 03/13/23  9:54 AM  Result Value Ref Range   HIV 1&2 Ab, 4th Generation NON-REACTIVE NON-REACTIVE    Comment: HIV-1 antigen and HIV-1/HIV-2 antibodies were not detected. There is no laboratory evidence of HIV infection. Marland Kitchen PLEASE NOTE: This information has been disclosed to you from records whose confidentiality may be protected by state law.  If your state requires such protection, then the state law prohibits you from making any further disclosure of the information without the specific written consent of the person to whom it pertains, or as otherwise permitted by law. A general authorization for the release of medical or other information is NOT sufficient for this purpose. . For additional information please refer to http://education.[facility].com/faq/FAQ106 (This link is being provided for informational/ educational purposes only.) . Marland Kitchen The performance of this  assay has not been clinically validated in patients less than 49 years old. .   Lipid panel     Status: None   Collection Time: 03/13/23  9:54 AM  Result Value Ref Range   Cholesterol 167 <200 mg/dL   HDL 52 > OR = 40 mg/dL   Triglycerides 70 <409 mg/dL   LDL Cholesterol (Calc) 99 mg/dL (calc)    Comment: Reference range: <100 . Desirable range <100 mg/dL for primary prevention;   <70 mg/dL for patients with  CHD or diabetic patients  with > or = 2 CHD risk factors. Marland Kitchen LDL-C is now calculated using the Martin-Hopkins  calculation, which is a validated novel method providing  better accuracy than the Friedewald equation in the  estimation of LDL-C.  Horald Pollen et al. Lenox Ahr. 8119;147(82): 2061-2068  (http://education.[facility].com/faq/FAQ164)    Total CHOL/HDL Ratio 3.2 <5.0 (calc)   Non-HDL Cholesterol (Calc) 115 <130 mg/dL (calc)    Comment: For patients with diabetes plus 1 major ASCVD risk  factor, treating to a non-HDL-C goal of <100 mg/dL  (LDL-C of <95 mg/dL) is considered a therapeutic  option.      PHQ2/9:    03/13/2023    8:48 AM 06/12/2022    8:02 AM 12/12/2021    8:11 AM 06/06/2021    8:35 AM 05/12/2020    9:51 AM  Depression screen PHQ 2/9  Decreased Interest 0 0 0 0 0  Down, Depressed, Hopeless 0 0 0 0 0  PHQ - 2 Score 0 0 0 0 0  Altered sleeping 0  0 0 0  Tired, decreased energy 0  0 0 0  Change in appetite 0  0 0 0  Feeling bad or failure about yourself  0  0 0 0  Trouble concentrating 0  0 0 0  Moving slowly or fidgety/restless 0  0 0 0  Suicidal thoughts 0  0 0 0  PHQ-9 Score 0  0 0 0  Difficult doing work/chores   Not difficult at all Not difficult at all Not difficult at all      Fall Risk:    04/30/2023    8:59 AM 03/13/2023    8:48 AM 06/12/2022    8:02 AM 12/12/2021    8:10 AM 06/06/2021    8:35 AM  Fall Risk   Falls in the past year? 0 0 0 0 0  Number falls in past yr:  0 0 0 0  Injury with Fall?  0 0 0 0  Risk for fall due to : No Fall Risks No Fall Risks     Follow up Falls prevention discussed Falls prevention discussed Falls evaluation completed  Falls evaluation completed      Functional Status Survey: Is the patient deaf or have difficulty hearing?: No Does the patient have difficulty seeing, even when wearing glasses/contacts?: No Does the patient have difficulty concentrating, remembering, or making decisions?: No Does the patient  have difficulty walking or climbing stairs?: No Does the patient have difficulty dressing or bathing?: No Does the patient have difficulty doing errands alone such as visiting a doctor's office or shopping?: No    Assessment & Plan  Problem List Items Addressed This Visit       Endocrine   New onset type 2 diabetes mellitus (HCC) - Primary    Newly diagnosed, currently in goal and not medically managed Patient was previously prediabetic but this has progressed to T2DM at this time  Discussed diabetes disease process,  types of diabetes, goals of care (keeping A1c <7.0), nutrition basics, exercise  Patient's current A1c is 6.7% - medications not currently indicated  Patient reports motivation to manage with lifestyle measures at this time- offered Diabetes education services referral but he declined, stating he does not think he can fit them into schedule currently  He was open to patient education materials in AVS and recommend he review American Diabetes Association patient information We reviewed need for retinal exams at yearly eye exams, foot health, kidney health He is currently on statin therapy - may need to be adjusted as LDL is 99 but dietary changes may also accomplish this Recommend follow up in 4 months to assess progress, recheck A1c and labs for monitoring       Other Visit Diagnoses     Hypercalcemia     New concern from CPE labs Calcium at 10.6  Reviewed changing diet and reducing use of vitamins  Recheck in 4 months or sooner if concerns arise        A significant portion of time was spent on patient education regarding diabetes and overall health: approx 25 minutes was spent on these efforts in addition to regular apt time.   Return in about 4 months (around 08/30/2023) for Diabetes follow up, HTN, HLD - recheck labs .   I, Milarose Savich E Harvel Meskill, PA-C, have reviewed all documentation for this visit. The documentation on 04/30/23 for the exam, diagnosis, procedures,  and orders are all accurate and complete.   Jacquelin Hawking, MHS, PA-C [facility]

## 2023-04-30 NOTE — Patient Instructions (Addendum)
Your A1c was 6.7 % which means you are now a Type 2 Diabetic  The goal for people with diabetes is less than 7%  Since you are below this, we do not need to start a medication right now. We can try to manage this with lifestyle changes to prolong your time without needing a medication.   The American Diabetes Association has a lot of patient information on living a healthy life with diabetes  I would encourage you to check out this website for more information and guides   It is important to note that we will need you to get your eyes checked once per year - you need to tell your eye doctor that you need a diabetic eye exam. Please have the eye doctor's office fax over the results of that exam to our office  You will need to have your feet checked by your PCP once per year to make sure there isn't any skin breakdown or loss of sensation We will need to make sure that your blood pressure and cholesterol are managed well too. These conditions in conjunction with diabetes can have a cumulative effect on your health so staying on top of all three is an important part of keeping you healthy   Try taking one of you vitamins per day and reducing your consumption of dairy or other food items that are high in calcium  We will need to see you again in about 4 months to recheck your blood work and see how you are doing

## 2023-04-30 NOTE — Assessment & Plan Note (Addendum)
Newly diagnosed, currently in goal and not medically managed Patient was previously prediabetic but this has progressed to T2DM at this time  Discussed diabetes disease process, types of diabetes, goals of care (keeping A1c <7.0), nutrition basics, exercise  Patient's current A1c is 6.7% - medications not currently indicated  Patient reports motivation to manage with lifestyle measures at this time- offered Diabetes education services referral but he declined, stating he does not think he can fit them into schedule currently  He was open to patient education materials in AVS and recommend he review American Diabetes Association patient information We reviewed need for retinal exams at yearly eye exams, foot health, kidney health He is currently on statin therapy - may need to be adjusted as LDL is 99 but dietary changes may also accomplish this Recommend follow up in 4 months to assess progress, recheck A1c and labs for monitoring

## 2023-06-30 ENCOUNTER — Other Ambulatory Visit: Payer: Self-pay | Admitting: Nurse Practitioner

## 2023-06-30 DIAGNOSIS — N401 Enlarged prostate with lower urinary tract symptoms: Secondary | ICD-10-CM

## 2023-06-30 DIAGNOSIS — E7800 Pure hypercholesterolemia, unspecified: Secondary | ICD-10-CM

## 2023-06-30 DIAGNOSIS — K219 Gastro-esophageal reflux disease without esophagitis: Secondary | ICD-10-CM

## 2023-07-01 NOTE — Telephone Encounter (Signed)
Requested Prescriptions  Pending Prescriptions Disp Refills   atorvastatin (LIPITOR) 20 MG tablet [Pharmacy Med Name: ATORVASTATIN 20 MG TABLET] 90 tablet 0    Sig: TAKE 1 TABLET BY MOUTH EVERY DAY     Cardiovascular:  Antilipid - Statins Failed - 06/30/2023  2:38 AM      Failed - Lipid Panel in normal range within the last 12 months    Cholesterol  Date Value Ref Range Status  03/13/2023 167 <200 mg/dL Final   LDL Cholesterol (Calc)  Date Value Ref Range Status  03/13/2023 99 mg/dL (calc) Final    Comment:    Reference range: <100 . Desirable range <100 mg/dL for primary prevention;   <70 mg/dL for patients with CHD or diabetic patients  with > or = 2 CHD risk factors. Marland Kitchen LDL-C is now calculated using the Martin-Hopkins  calculation, which is a validated novel method providing  better accuracy than the Friedewald equation in the  estimation of LDL-C.  Horald Pollen et al. Lenox Ahr. 1610;960(45): 2061-2068  (http://education.[facility].com/faq/FAQ164)    HDL  Date Value Ref Range Status  03/13/2023 52 > OR = 40 mg/dL Final   Triglycerides  Date Value Ref Range Status  03/13/2023 70 <150 mg/dL Final         Passed - Patient is not pregnant      Passed - Valid encounter within last 12 months    Recent Outpatient Visits           2 months ago New onset type 2 diabetes mellitus (HCC)   [facility] Mecum, Oswaldo Conroy, PA-C   3 months ago Annual physical exam   [facility] Danelle Berry, PA-C   1 year ago Primary hypertension   [facility] Berniece Salines, FNP   1 year ago Primary hypertension   [place] [facility] Berniece Salines, FNP   2 years ago Primary hypertension   [place] [facility] Caro Laroche, DO       Future Appointments             In 2 months Danelle Berry, PA-C [facility], [facility]   In 2 months Danelle Berry,  PA-C [facility], [facility]   In 8 months Danelle Berry, PA-C [place] [facility], [facility]             tamsulosin (FLOMAX) 0.4 MG CAPS capsule [Pharmacy Med Name: TAMSULOSIN HCL 0.4 MG CAPSULE] 90 capsule 0    Sig: TAKE 1 CAPSULE BY MOUTH EVERYDAY AT BEDTIME     Urology: Alpha-Adrenergic Blocker Failed - 06/30/2023  2:38 AM      Failed - PSA in normal range and within 360 days    PSA  Date Value Ref Range Status  06/12/2022 0.88 < OR = 4.00 ng/mL Final    Comment:    The total PSA value from this assay system is  standardized against the WHO standard. The test  result will be approximately 20% lower when compared  to the equimolar-standardized total PSA (Beckman  Coulter). Comparison of serial PSA results should be  interpreted with this fact in mind. . This test was performed using the Siemens  chemiluminescent method. Values obtained from  different assay methods cannot be used interchangeably. PSA levels, regardless of value, should not be interpreted as absolute evidence of the presence or absence of disease.  Passed - Last BP in normal range    BP Readings from Last 1 Encounters:  04/30/23 138/84         Passed - Valid encounter within last 12 months    Recent Outpatient Visits           2 months ago New onset type 2 diabetes mellitus (HCC)   [place] [facility] Mecum, Oswaldo Conroy, PA-C   3 months ago Annual physical exam   [facility] Danelle Berry, PA-C   1 year ago Primary hypertension   [place] [facility] Berniece Salines, FNP   1 year ago Primary hypertension   [place] [facility] Berniece Salines, FNP   2 years ago Primary hypertension   [facility] Caro Laroche, DO       Future Appointments             In 2 months Danelle Berry, PA-C [facility], [facility]   In 2 months Danelle Berry, PA-C [facility], [facility]   In 8 months Danelle Berry, PA-C [facility], [facility]             omeprazole (PRILOSEC) 40 MG capsule [Pharmacy Med Name: OMEPRAZOLE DR 40 MG CAPSULE] 90 capsule 0    Sig: TAKE 1 CAPSULE (40 MG TOTAL) BY MOUTH DAILY.     Gastroenterology: Proton Pump Inhibitors Passed - 06/30/2023  2:38 AM      Passed - Valid encounter within last 12 months    Recent Outpatient Visits           2 months ago New onset type 2 diabetes mellitus (HCC)   [place] [facility] Mecum, Oswaldo Conroy, PA-C   3 months ago Annual physical exam   [facility] Danelle Berry, PA-C   1 year ago Primary hypertension   [facility] Berniece Salines, FNP   1 year ago Primary hypertension   [facility] Berniece Salines, FNP   2 years ago Primary hypertension   [facility] Caro Laroche, DO       Future Appointments             In 2 months Danelle Berry, PA-C [facility], [facility]   In 2 months Danelle Berry, PA-C [facility], [facility]   In 8 months Danelle Berry, PA-C [facility]

## 2023-09-02 ENCOUNTER — Ambulatory Visit: Payer: BC Managed Care – PPO | Admitting: Physician Assistant

## 2023-09-12 ENCOUNTER — Ambulatory Visit (INDEPENDENT_AMBULATORY_CARE_PROVIDER_SITE_OTHER): Payer: BC Managed Care – PPO | Admitting: Physician Assistant

## 2023-09-12 ENCOUNTER — Encounter: Payer: Self-pay | Admitting: Physician Assistant

## 2023-09-12 VITALS — BP 130/82 | HR 78 | Temp 97.7°F | Resp 16 | Ht 67.0 in | Wt 185.5 lb

## 2023-09-12 DIAGNOSIS — E119 Type 2 diabetes mellitus without complications: Secondary | ICD-10-CM

## 2023-09-12 DIAGNOSIS — I10 Essential (primary) hypertension: Secondary | ICD-10-CM

## 2023-09-12 DIAGNOSIS — E782 Mixed hyperlipidemia: Secondary | ICD-10-CM | POA: Diagnosis not present

## 2023-09-12 DIAGNOSIS — Z23 Encounter for immunization: Secondary | ICD-10-CM | POA: Diagnosis not present

## 2023-09-12 NOTE — Assessment & Plan Note (Signed)
Chronic, historic condition Patient is currently taking atorvastatin 20 mg p.o. daily and appears to be tolerating well Recheck lipid panel today Results to dictate further management For now continue current regimen Follow-up in 6 months or sooner if concerns arise

## 2023-09-12 NOTE — Assessment & Plan Note (Signed)
Newly diagnosed this year Most recent A1c was 6.7, recheck today, results to dictate further management He reports doing exercises 2-3 times per week with light calisthenics and has tried to eat overall healthy diet with increased vegetable intake and reduction in fried foods Reviewed importance of annual diabetic eye exam with his vision doctor For now continue lifestyle modifications unless intervention is indicated on lab results Follow-up in 3 months or sooner if concerns arise

## 2023-09-12 NOTE — Progress Notes (Signed)
Established Patient Office Visit  Name: Bryan Green   MRN: [id number]    DOB: 1970-12-04   Date:09/12/2023  Today's Provider: Jacquelin Hawking, MHS, PA-C Introduced myself to the patient as a PA-C and provided education on APPs in clinical practice.         Subjective  Chief Complaint  Chief Complaint  Patient presents with   Diabetes   Hypertension   Hyperlipidemia    HPI   HYPERTENSION / HYPERLIPIDEMIA Satisfied with current treatment? yes Duration of hypertension: chronic BP monitoring frequency: a few times a month BP range: has been getting readings in 130s-140s/80s  BP medication side effects: no Past BP meds: chlorthalidone Duration of hyperlipidemia: chronic Cholesterol medication side effects: no Cholesterol supplements: none Past cholesterol medications: atorvastain (lipitor) Medication compliance: good compliance Aspirin: no Recent stressors: no Recurrent headaches: no Visual changes: no Palpitations: no Dyspnea: no Chest pain: no Lower extremity edema: no Dizzy/lightheaded: no  Diabetes, Type 2 - Last A1c 6.7% - Medications: not on medications- managing with lifestyle  - Compliance: NA - Checking BG at home: not checking  - Diet: He is trying to eat mostly salads and fruits, increased vegetable intake, trying to reduce fried food intake, eating boiled chicken and fish options regularly  - Exercise: he is exercising regularly with calisthenics 2 -3 times per week  - Eye exam:  reviewed importance of annual eye exam- he sees [facility] in [place]  - Foot exam: completed today  - Microalbumin: ordered today  - Statin: on statin therapy  - PNA vaccine: NA  - Denies symptoms of hypoglycemia, polyuria, polydipsia, numbness extremities, foot ulcers/trauma      Patient Active Problem List   Diagnosis Date Noted   New onset type 2 diabetes mellitus (HCC) 03/19/2023   Pure hypercholesterolemia 06/12/2022   Benign prostatic hyperplasia  with incomplete bladder emptying 06/06/2021   Finger pain 06/06/2021   Hyperlipidemia 06/02/2018   Gastroesophageal reflux disease 04/27/2018   Primary hypertension 08/25/2017    Past Surgical History:  Procedure Laterality Date   COLONOSCOPY WITH PROPOFOL N/A 12/01/2020   Procedure: COLONOSCOPY WITH PROPOFOL;  Surgeon: Wyline Mood, MD;  Location: [facility] ENDOSCOPY;  Service: Gastroenterology;  Laterality: N/A;   ESOPHAGOGASTRODUODENOSCOPY (EGD) WITH PROPOFOL N/A 12/01/2020   Procedure: ESOPHAGOGASTRODUODENOSCOPY (EGD) WITH PROPOFOL;  Surgeon: Wyline Mood, MD;  Location: [facility] ENDOSCOPY;  Service: Gastroenterology;  Laterality: N/A;   UPPER GI ENDOSCOPY Bilateral 2013    Family History  Problem Relation Age of Onset   Diabetes Mother    Cancer Father        Stomach   Kidney disease Father    Hypertension Father     Social History   Tobacco Use   Smoking status: Former    Current packs/day: 0.00    Average packs/day: 0.8 packs/day for 22.0 years (16.5 ttl pk-yrs)    Types: Cigarettes    Start date: 27    Quit date: 2010    Years since quitting: 14.8   Smokeless tobacco: Never   Tobacco comments:    patient stated that he quit 2010  Substance Use Topics   Alcohol use: No    Alcohol/week: 0.0 standard drinks of alcohol     Current Outpatient Medications:    atorvastatin (LIPITOR) 20 MG tablet, TAKE 1 TABLET BY MOUTH EVERY DAY, Disp: 90 tablet, Rfl: 0   chlorthalidone (HYGROTON) 25 MG tablet, TAKE 1 TABLET (25 MG TOTAL) BY MOUTH DAILY.,  Disp: 90 tablet, Rfl: 1   omeprazole (PRILOSEC) 40 MG capsule, TAKE 1 CAPSULE (40 MG TOTAL) BY MOUTH DAILY., Disp: 90 capsule, Rfl: 0   tamsulosin (FLOMAX) 0.4 MG CAPS capsule, TAKE 1 CAPSULE BY MOUTH EVERYDAY AT BEDTIME, Disp: 90 capsule, Rfl: 0  Allergies  Allergen Reactions   Lisinopril     Dizziness, headache and drowsy     I personally reviewed active problem list, medication list, allergies, health maintenance, notes from last  encounter, lab results with the patient/caregiver today.   ROS  See HPI for relevant ROS   Objective  Vitals:   09/12/23 0856  BP: 130/82  Pulse: 78  Resp: 16  Temp: 97.7 F (36.5 C)  TempSrc: Oral  SpO2: 97%  Weight: 185 lb 8 oz (84.1 kg)  Height: 5\' 7"  (1.702 m)    Body mass index is 29.05 kg/m.  Physical Exam Vitals reviewed.  Constitutional:      General: He is awake.     Appearance: Normal appearance. He is well-developed and well-groomed.  HENT:     Head: Normocephalic and atraumatic.  Cardiovascular:     Rate and Rhythm: Normal rate and regular rhythm.     Pulses: Normal pulses.          Radial pulses are 2+ on the right side and 2+ on the left side.     Heart sounds: Normal heart sounds. No murmur heard.    No friction rub. No gallop.  Pulmonary:     Effort: Pulmonary effort is normal.     Breath sounds: Normal breath sounds. No decreased air movement. No decreased breath sounds, wheezing, rhonchi or rales.  Musculoskeletal:     Cervical back: Normal range of motion.     Right lower leg: No edema.     Left lower leg: No edema.  Neurological:     General: No focal deficit present.     Mental Status: He is alert and oriented to person, place, and time. Mental status is at baseline.     GCS: GCS eye subscore is 4. GCS verbal subscore is 5. GCS motor subscore is 6.  Psychiatric:        Attention and Perception: Attention and perception normal.        Mood and Affect: Mood and affect normal.        Speech: Speech normal.        Behavior: Behavior normal. Behavior is cooperative.        Thought Content: Thought content normal.        Cognition and Memory: Cognition normal.      No results found for this or any previous visit (from the past 2160 hour(s)).   PHQ2/9:    09/12/2023    8:55 AM 03/13/2023    8:48 AM 06/12/2022    8:02 AM 12/12/2021    8:11 AM 06/06/2021    8:35 AM  Depression screen PHQ 2/9  Decreased Interest 0 0 0 0 0  Down,  Depressed, Hopeless 0 0 0 0 0  PHQ - 2 Score 0 0 0 0 0  Altered sleeping 0 0  0 0  Tired, decreased energy 0 0  0 0  Change in appetite 0 0  0 0  Feeling bad or failure about yourself  0 0  0 0  Trouble concentrating 0 0  0 0  Moving slowly or fidgety/restless 0 0  0 0  Suicidal thoughts 0 0  0 0  PHQ-9 Score  0 0  0 0  Difficult doing work/chores Not difficult at all   Not difficult at all Not difficult at all      Fall Risk:    09/12/2023    8:55 AM 04/30/2023    8:59 AM 03/13/2023    8:48 AM 06/12/2022    8:02 AM 12/12/2021    8:10 AM  Fall Risk   Falls in the past year? 0 0 0 0 0  Number falls in past yr: 0  0 0 0  Injury with Fall? 0  0 0 0  Risk for fall due to : No Fall Risks No Fall Risks No Fall Risks    Follow up Falls prevention discussed;Education provided;Falls evaluation completed Falls prevention discussed Falls prevention discussed Falls evaluation completed       Functional Status Survey: Is the patient deaf or have difficulty hearing?: No Does the patient have difficulty seeing, even when wearing glasses/contacts?: No Does the patient have difficulty concentrating, remembering, or making decisions?: No Does the patient have difficulty walking or climbing stairs?: No Does the patient have difficulty dressing or bathing?: No Does the patient have difficulty doing errands alone such as visiting a doctor's office or shopping?: No    Assessment & Plan  Problem List Items Addressed This Visit       Cardiovascular and Mediastinum   Primary hypertension    Chronic, historic condition He is currently taking chlorthalidone 25 mg p.o. daily and appears to be tolerating well Blood pressure was close to goal today and he reports similar readings at home Recommend he increase his exercise efforts and follows HTN dietary medications for further control If blood pressure is not improved at next follow-up meeting we may need to start an additional agent for optimal  control For now continue current regimen Follow-up in 3 months or sooner if concerns arise      Relevant Orders   COMPLETE METABOLIC PANEL WITH GFR   CBC w/Diff/Platelet     Endocrine   New onset type 2 diabetes mellitus (HCC) - Primary    Newly diagnosed this year Most recent A1c was 6.7, recheck today, results to dictate further management He reports doing exercises 2-3 times per week with light calisthenics and has tried to eat overall healthy diet with increased vegetable intake and reduction in fried foods Reviewed importance of annual diabetic eye exam with his vision doctor For now continue lifestyle modifications unless intervention is indicated on lab results Follow-up in 3 months or sooner if concerns arise      Relevant Orders   Hemoglobin A1c   HM DIABETES FOOT EXAM (Completed)   Microalbumin / creatinine urine ratio     Other   Hyperlipidemia    Chronic, historic condition Patient is currently taking atorvastatin 20 mg p.o. daily and appears to be tolerating well Recheck lipid panel today Results to dictate further management For now continue current regimen Follow-up in 6 months or sooner if concerns arise      Relevant Orders   Lipid Profile   Other Visit Diagnoses     Need for influenza vaccination       Relevant Orders   Flu vaccine trivalent PF, 6mos and older(Flulaval,Afluria,Fluarix,Fluzone) (Completed)        Return in about 4 months (around 01/13/2024) for DM, HLD, HTN.   I, Edmon Magid E Alizabeth Antonio, PA-C, have reviewed all documentation for this visit. The documentation on 09/12/23 for the exam, diagnosis, procedures, and orders are all accurate and  complete.   Jacquelin Hawking, MHS, PA-C [facility]

## 2023-09-12 NOTE — Assessment & Plan Note (Signed)
Chronic, historic condition He is currently taking chlorthalidone 25 mg p.o. daily and appears to be tolerating well Blood pressure was close to goal today and he reports similar readings at home Recommend he increase his exercise efforts and follows HTN dietary medications for further control If blood pressure is not improved at next follow-up meeting we may need to start an additional agent for optimal control For now continue current regimen Follow-up in 3 months or sooner if concerns arise

## 2023-09-12 NOTE — Patient Instructions (Signed)
Please make sure you tell your vision doctor that you have been diagnosed with diabetes and will need screening for retinopathy You will need this on a yearly basis and they should sent the results to our office once available   It was nice to see you and I appreciate the opportunity to be involved in your care If you were satisfied with the care you received from me, I would greatly appreciate you saying so in the after-visit survey that is sent out following our visit.

## 2023-09-13 LAB — MICROALBUMIN / CREATININE URINE RATIO
Creatinine, Urine: 163 mg/dL (ref 20–320)
Microalb Creat Ratio: 12 mg/g{creat} (ref <30)
Microalb, Ur: 1.9 mg/dL

## 2023-09-13 LAB — CBC WITH DIFFERENTIAL/PLATELET
Absolute Lymphocytes: 1082 {cells}/uL (ref 850–3900)
Absolute Monocytes: 590 {cells}/uL (ref 200–950)
Basophils Absolute: 41 {cells}/uL (ref 0–200)
Basophils Relative: 1 %
Eosinophils Absolute: 242 {cells}/uL (ref 15–500)
Eosinophils Relative: 5.9 %
HCT: 40.1 % (ref 38.5–50.0)
Hemoglobin: 12.8 g/dL — ABNORMAL LOW (ref 13.2–17.1)
MCH: 26.9 pg — ABNORMAL LOW (ref 27.0–33.0)
MCHC: 31.9 g/dL — ABNORMAL LOW (ref 32.0–36.0)
MCV: 84.4 fL (ref 80.0–100.0)
MPV: 11.5 fL (ref 7.5–12.5)
Monocytes Relative: 14.4 %
Neutro Abs: 2144 {cells}/uL (ref 1500–7800)
Neutrophils Relative %: 52.3 %
Platelets: 211 10*3/uL (ref 140–400)
RBC: 4.75 10*6/uL (ref 4.20–5.80)
RDW: 12.7 % (ref 11.0–15.0)
Total Lymphocyte: 26.4 %
WBC: 4.1 10*3/uL (ref 3.8–10.8)

## 2023-09-13 LAB — HEMOGLOBIN A1C
Hgb A1c MFr Bld: 6.4 %{Hb} — ABNORMAL HIGH (ref <5.7)
Mean Plasma Glucose: 137 mg/dL
eAG (mmol/L): 7.6 mmol/L

## 2023-09-13 LAB — LIPID PANEL
Cholesterol: 239 mg/dL — ABNORMAL HIGH (ref <200)
HDL: 49 mg/dL (ref >=40)
LDL Cholesterol (Calc): 166 mg/dL — ABNORMAL HIGH
Non-HDL Cholesterol (Calc): 190 mg/dL — ABNORMAL HIGH (ref <130)
Total CHOL/HDL Ratio: 4.9 (calc) (ref <5.0)
Triglycerides: 111 mg/dL (ref <150)

## 2023-09-13 LAB — COMPLETE METABOLIC PANEL WITH GFR
AG Ratio: 1.6 (calc) (ref 1.0–2.5)
ALT: 23 U/L (ref 9–46)
AST: 23 U/L (ref 10–35)
Albumin: 4.2 g/dL (ref 3.6–5.1)
Alkaline phosphatase (APISO): 53 U/L (ref 35–144)
BUN: 21 mg/dL (ref 7–25)
CO2: 32 mmol/L (ref 20–32)
Calcium: 9.7 mg/dL (ref 8.6–10.3)
Chloride: 103 mmol/L (ref 98–110)
Creat: 1.26 mg/dL (ref 0.70–1.30)
Globulin: 2.6 g/dL (ref 1.9–3.7)
Glucose, Bld: 92 mg/dL (ref 65–99)
Potassium: 3.7 mmol/L (ref 3.5–5.3)
Sodium: 142 mmol/L (ref 135–146)
Total Bilirubin: 0.2 mg/dL (ref 0.2–1.2)
Total Protein: 6.8 g/dL (ref 6.1–8.1)
eGFR: 68 mL/min/{1.73_m2} (ref >=60)

## 2023-09-16 NOTE — Addendum Note (Signed)
Addended by: Jacquelin Hawking on: 09/16/2023 03:34 PM   Modules accepted: Orders

## 2023-09-16 NOTE — Progress Notes (Signed)
Your labs are back Your cholesterol is elevated from the previous 6 months.  At this time I recommend starting a statin medication to help control your cholesterol.  If you would like to proceed with this please let us know and I will send a prescription in. Your CBC demonstrates very mild anemia.  At this time I do not recommend intervention but we should monitor this and recheck in about 2 weeks.  Please stop by the office to get your labs done at that time. Your A1c is decreased to 6.4 which is great.  Please continue with your current efforts Your electrolytes, liver and kidney function are in normal levels Your urine microalbumin was in normal range.  Please let us know he would like to start the statin or work on diet and exercise until your next follow-up.  Please let us know if you have questions or concerns

## 2023-09-28 ENCOUNTER — Other Ambulatory Visit: Payer: Self-pay | Admitting: Nurse Practitioner

## 2023-09-28 DIAGNOSIS — K219 Gastro-esophageal reflux disease without esophagitis: Secondary | ICD-10-CM

## 2023-09-28 DIAGNOSIS — N401 Enlarged prostate with lower urinary tract symptoms: Secondary | ICD-10-CM

## 2023-09-28 DIAGNOSIS — E7800 Pure hypercholesterolemia, unspecified: Secondary | ICD-10-CM

## 2023-09-29 NOTE — Telephone Encounter (Signed)
Requested medications are due for refill today.  yes  Requested medications are on the active medications list.  yes  Last refill. 07/01/2023 #90 0 rf  Future visit scheduled.   yes  Notes to clinic.  Missing lab.    Requested Prescriptions  Pending Prescriptions Disp Refills   tamsulosin (FLOMAX) 0.4 MG CAPS capsule [Pharmacy Med Name: TAMSULOSIN HCL 0.4 MG CAPSULE] 90 capsule 0    Sig: TAKE 1 CAPSULE BY MOUTH EVERYDAY AT BEDTIME     Urology: Alpha-Adrenergic Blocker Failed - 09/28/2023  8:49 AM      Failed - PSA in normal range and within 360 days    PSA  Date Value Ref Range Status  06/12/2022 0.88 < OR = 4.00 ng/mL Final    Comment:    The total PSA value from this assay system is  standardized against the WHO standard. The test  result will be approximately 20% lower when compared  to the equimolar-standardized total PSA (Beckman  Coulter). Comparison of serial PSA results should be  interpreted with this fact in mind. . This test was performed using the Siemens  chemiluminescent method. Values obtained from  different assay methods cannot be used interchangeably. PSA levels, regardless of value, should not be interpreted as absolute evidence of the presence or absence of disease.          Passed - Last BP in normal range    BP Readings from Last 1 Encounters:  09/12/23 130/82         Passed - Valid encounter within last 12 months    Recent Outpatient Visits           2 weeks ago New onset type 2 diabetes mellitus (HCC)   [place] [facility] Mecum, Erin E, PA-C   5 months ago New onset type 2 diabetes mellitus [facility])   [facility] Mecum, Oswaldo Conroy, PA-C   6 months ago Annual physical exam   [facility] Danelle Berry, PA-C   1 year ago Primary hypertension   [facility] Berniece Salines, FNP   1 year ago Primary hypertension   [place] [facility] Berniece Salines, FNP       Future Appointments             In 3 months Mecum, Oswaldo Conroy, PA-C [facility]   In 5 months Danelle Berry, PA-C [facility]            Signed Prescriptions Disp Refills   atorvastatin (LIPITOR) 20 MG tablet 90 tablet 3    Sig: TAKE 1 TABLET BY MOUTH EVERY DAY     Cardiovascular:  Antilipid - Statins Failed - 09/28/2023  8:49 AM      Failed - Lipid Panel in normal range within the last 12 months    Cholesterol  Date Value Ref Range Status  09/12/2023 239 (H) <200 mg/dL Final   LDL Cholesterol (Calc)  Date Value Ref Range Status  09/12/2023 166 (H) mg/dL (calc) Final    Comment:    Reference range: <100 . Desirable range <100 mg/dL for primary prevention;   <70 mg/dL for patients with CHD or diabetic patients  with > or = 2 CHD risk factors. Marland Kitchen LDL-C is now calculated using the Martin-Hopkins  calculation, which is a validated novel method providing  better accuracy than the Friedewald equation in the  estimation of LDL-C.  Horald Pollen et al. Lenox Ahr. 4098;119(14): 2061-2068  (http://education.[facility].com/faq/FAQ164)    HDL  Date Value Ref Range Status  09/12/2023 49 > OR = 40 mg/dL Final   Triglycerides  Date Value Ref Range Status  09/12/2023 111 <150 mg/dL Final         Passed - Patient is not pregnant      Passed - Valid encounter within last 12 months    Recent Outpatient Visits           2 weeks ago New onset type 2 diabetes mellitus (HCC)   [facility] Mecum, Erin E, PA-C   5 months ago New onset type 2 diabetes mellitus [facility])   [facility] Mecum, Oswaldo Conroy, PA-C   6 months ago Annual physical exam   [place] [facility] Danelle Berry, PA-C   1 year ago Primary hypertension   [place] [facility] Berniece Salines, FNP   1 year ago Primary hypertension   [facility] Berniece Salines, FNP       Future Appointments             In 3 months Mecum, Oswaldo Conroy, PA-C [place] [facility], [facility]   In 5 months Danelle Berry, PA-C [place] [facility], [facility]             omeprazole (PRILOSEC) 40 MG capsule 90 capsule 3    Sig: TAKE 1 CAPSULE (40 MG TOTAL) BY MOUTH DAILY.     Gastroenterology: Proton Pump Inhibitors Passed - 09/28/2023  8:49 AM      Passed - Valid encounter within last 12 months    Recent Outpatient Visits           2 weeks ago New onset type 2 diabetes mellitus (HCC)   [place] [facility] Mecum, Erin E, PA-C   5 months ago New onset type 2 diabetes mellitus [facility])   [facility] Mecum, Oswaldo Conroy, PA-C   6 months ago Annual physical exam   [facility] Danelle Berry, PA-C   1 year ago Primary hypertension   [facility] Berniece Salines, FNP   1 year ago Primary hypertension   [facility] Berniece Salines, FNP       Future Appointments             In 3 months Mecum, Oswaldo Conroy, PA-C [facility], [facility]   In 5 months Danelle Berry, PA-C [facility]

## 2023-09-29 NOTE — Telephone Encounter (Signed)
Requested Prescriptions  Pending Prescriptions Disp Refills   atorvastatin (LIPITOR) 20 MG tablet [Pharmacy Med Name: ATORVASTATIN 20 MG TABLET] 90 tablet 3    Sig: TAKE 1 TABLET BY MOUTH EVERY DAY     Cardiovascular:  Antilipid - Statins Failed - 09/28/2023  8:49 AM      Failed - Lipid Panel in normal range within the last 12 months    Cholesterol  Date Value Ref Range Status  09/12/2023 239 (H) <200 mg/dL Final   LDL Cholesterol (Calc)  Date Value Ref Range Status  09/12/2023 166 (H) mg/dL (calc) Final    Comment:    Reference range: <100 . Desirable range <100 mg/dL for primary prevention;   <70 mg/dL for patients with CHD or diabetic patients  with > or = 2 CHD risk factors. Marland Kitchen LDL-C is now calculated using the Martin-Hopkins  calculation, which is a validated novel method providing  better accuracy than the Friedewald equation in the  estimation of LDL-C.  Horald Pollen et al. Lenox Ahr. 6045;409(81): 2061-2068  (http://education.[facility].com/faq/FAQ164)    HDL  Date Value Ref Range Status  09/12/2023 49 > OR = 40 mg/dL Final   Triglycerides  Date Value Ref Range Status  09/12/2023 111 <150 mg/dL Final         Passed - Patient is not pregnant      Passed - Valid encounter within last 12 months    Recent Outpatient Visits           2 weeks ago New onset type 2 diabetes mellitus (HCC)   [place] Mecum, Erin E, PA-C   5 months ago New onset type 2 diabetes mellitus [facility])   [place] [facility] Mecum, Oswaldo Conroy, PA-C   6 months ago Annual physical exam   [facility] Berry, PA-C   1 year ago Primary hypertension   [place] [facility] Berniece Salines, FNP   1 year ago Primary hypertension   [facility] Berniece Salines, FNP       Future Appointments             In 3 months Mecum, Oswaldo Conroy, PA-C [place] [facility], [facility]    In 5 months Danelle Berry, PA-C [facility], [facility]             omeprazole (PRILOSEC) 40 MG capsule [Pharmacy Med Name: OMEPRAZOLE DR 40 MG CAPSULE] 90 capsule 3    Sig: TAKE 1 CAPSULE (40 MG TOTAL) BY MOUTH DAILY.     Gastroenterology: Proton Pump Inhibitors Passed - 09/28/2023  8:49 AM      Passed - Valid encounter within last 12 months    Recent Outpatient Visits           2 weeks ago New onset type 2 diabetes mellitus (HCC)   [facility] Mecum, Erin E, PA-C   5 months ago New onset type 2 diabetes mellitus [facility])   [place] Mecum, Oswaldo Conroy, PA-C   6 months ago Annual physical exam   [facility] Danelle Berry, PA-C   1 year ago Primary hypertension   [facility] Berniece Salines, FNP   1 year ago Primary hypertension   [facility] Berniece Salines, FNP       Future Appointments  In 3 months Mecum, Oswaldo Conroy, PA-C [facility], [facility]   In 5 months Danelle Berry, PA-C [facility], [facility]             tamsulosin (FLOMAX) 0.4 MG CAPS capsule [Pharmacy Med Name: TAMSULOSIN HCL 0.4 MG CAPSULE] 90 capsule 0    Sig: TAKE 1 CAPSULE BY MOUTH EVERYDAY AT BEDTIME     Urology: Alpha-Adrenergic Blocker Failed - 09/28/2023  8:49 AM      Failed - PSA in normal range and within 360 days    PSA  Date Value Ref Range Status  06/12/2022 0.88 < OR = 4.00 ng/mL Final    Comment:    The total PSA value from this assay system is  standardized against the WHO standard. The test  result will be approximately 20% lower when compared  to the equimolar-standardized total PSA (Beckman  Coulter). Comparison of serial PSA results should be  interpreted with this fact in mind. . This test was performed using the Siemens  chemiluminescent method. Values obtained from  different  assay methods cannot be used interchangeably. PSA levels, regardless of value, should not be interpreted as absolute evidence of the presence or absence of disease.          Passed - Last BP in normal range    BP Readings from Last 1 Encounters:  09/12/23 130/82         Passed - Valid encounter within last 12 months    Recent Outpatient Visits           2 weeks ago New onset type 2 diabetes mellitus (HCC)   [place] Mecum, Erin E, PA-C   5 months ago New onset type 2 diabetes mellitus [facility])    [facility] Mecum, Oswaldo Conroy, PA-C   6 months ago Annual physical exam   [facility] Dan[facility] Berry, PA-C   1 year ago Primary hypertension   [facility] Berniece Salines, FNP   1 year ago Primary hypertension   [facility] Berniece Salines, FNP       Future Appointments             In 3 months Mecum, Oswaldo Conroy, PA-C [facility], [facility]   In 5 months Danelle Berry, PA-C [facility]

## 2023-10-08 ENCOUNTER — Other Ambulatory Visit: Payer: Self-pay | Admitting: Family Medicine

## 2023-10-08 ENCOUNTER — Other Ambulatory Visit: Payer: Self-pay | Admitting: Nurse Practitioner

## 2023-10-08 DIAGNOSIS — I10 Essential (primary) hypertension: Secondary | ICD-10-CM

## 2023-10-08 DIAGNOSIS — N401 Enlarged prostate with lower urinary tract symptoms: Secondary | ICD-10-CM

## 2023-10-09 NOTE — Telephone Encounter (Signed)
Requested Prescriptions  Pending Prescriptions Disp Refills   tamsulosin (FLOMAX) 0.4 MG CAPS capsule [Pharmacy Med Name: TAMSULOSIN HCL 0.4 MG CAPSULE] 90 capsule 0    Sig: TAKE 1 CAPSULE BY MOUTH EVERYDAY AT BEDTIME     Urology: Alpha-Adrenergic Blocker Failed - 10/08/2023  9:57 AM      Failed - PSA in normal range and within 360 days    PSA  Date Value Ref Range Status  06/12/2022 0.88 < OR = 4.00 ng/mL Final    Comment:    The total PSA value from this assay system is  standardized against the WHO standard. The test  result will be approximately 20% lower when compared  to the equimolar-standardized total PSA (Beckman  Coulter). Comparison of serial PSA results should be  interpreted with this fact in mind. . This test was performed using the Siemens  chemiluminescent method. Values obtained from  different assay methods cannot be used interchangeably. PSA levels, regardless of value, should not be interpreted as absolute evidence of the presence or absence of disease.          Passed - Last BP in normal range    BP Readings from Last 1 Encounters:  09/12/23 130/82         Passed - Valid encounter within last 12 months    Recent Outpatient Visits           3 weeks ago New onset type 2 diabetes mellitus (HCC)   [place] [facility] Mecum, Erin E, PA-C   5 months ago New onset type 2 diabetes mellitus [facility])   [place] [facility] Mecum, Oswaldo Conroy, PA-C   7 months ago Annual physical exam   [place] [facility] Danelle Berry, PA-C   1 year ago Primary hypertension   [facility] Berniece Salines, FNP   1 year ago Primary hypertension   [facility] Berniece Salines, FNP       Future Appointments             In 3 months Mecum, Oswaldo Conroy, PA-C [facility], [facility]   In 5 months Danelle Berry, PA-C [facility], [facility]

## 2024-01-12 NOTE — Progress Notes (Unsigned)
 Name: Bryan Green   MRN: [id number]    DOB: 09-Jul-1971   Date:01/13/2024       Progress Note  Chief Complaint  Patient presents with   Medical Management of Chronic Issues   Hypertension   Diabetes     Subjective:   Bryan Green is a 53 y.o. male, presents to clinic for routine follow up on chronic conditions  Dx with T2DM last year He is managing with diet and lifestyle efforts, with improvement in A1C  Lab Results  Component Value Date   HGBA1C 6.4 (H) 09/12/2023   HGBA1C 6.7 (H) 03/13/2023   HGBA1C 5.8 (H) 06/12/2022   HGBA1C 6.1 (H) 12/12/2021   HTN on chlorthalidone BP Readings from Last 3 Encounters:  01/13/24 128/70  09/12/23 130/82  04/30/23 138/84    Atorvastatin 20 mg - may have been off meds at the time of last OV, because statin is not new, no SE or concerns Lab Results  Component Value Date   CHOL 239 (H) 09/12/2023   HDL 49 09/12/2023   LDLCALC 166 (H) 09/12/2023   TRIG 111 09/12/2023   CHOLHDL 4.9 09/12/2023   Overall he has worked on diet and lifestyle since last CPE and new onset DM Wt Readings from Last 5 Encounters:  01/13/24 185 lb (83.9 kg)  09/12/23 185 lb 8 oz (84.1 kg)  04/30/23 181 lb 8 oz (82.3 kg)  03/13/23 181 lb (82.1 kg)  06/12/22 186 lb 3.2 oz (84.5 kg)   BMI Readings from Last 5 Encounters:  01/13/24 28.98 kg/m  09/12/23 29.05 kg/m  04/30/23 28.43 kg/m  03/13/23 28.35 kg/m  06/12/22 29.16 kg/m       Current Outpatient Medications:    atorvastatin (LIPITOR) 20 MG tablet, TAKE 1 TABLET BY MOUTH EVERY DAY, Disp: 90 tablet, Rfl: 3   chlorthalidone (HYGROTON) 25 MG tablet, TAKE 1 TABLET (25 MG TOTAL) BY MOUTH DAILY., Disp: 90 tablet, Rfl: 1   omeprazole (PRILOSEC) 40 MG capsule, TAKE 1 CAPSULE (40 MG TOTAL) BY MOUTH DAILY., Disp: 90 capsule, Rfl: 3   tamsulosin (FLOMAX) 0.4 MG CAPS capsule, TAKE 1 CAPSULE BY MOUTH EVERYDAY AT BEDTIME, Disp: 90 capsule, Rfl: 0  Patient Active Problem List   Diagnosis Date Noted    New onset type 2 diabetes mellitus (HCC) 03/19/2023   Pure hypercholesterolemia 06/12/2022   Benign prostatic hyperplasia with incomplete bladder emptying 06/06/2021   Finger pain 06/06/2021   Hyperlipidemia 06/02/2018   Gastroesophageal reflux disease 04/27/2018   Primary hypertension 08/25/2017    Past Surgical History:  Procedure Laterality Date   COLONOSCOPY WITH PROPOFOL N/A 12/01/2020   Procedure: COLONOSCOPY WITH PROPOFOL;  Surgeon: Wyline Mood, MD;  Location: [facility] ENDOSCOPY;  Service: Gastroenterology;  Laterality: N/A;   ESOPHAGOGASTRODUODENOSCOPY (EGD) WITH PROPOFOL N/A 12/01/2020   Procedure: ESOPHAGOGASTRODUODENOSCOPY (EGD) WITH PROPOFOL;  Surgeon: Wyline Mood, MD;  Location: [facility] ENDOSCOPY;  Service: Gastroenterology;  Laterality: N/A;   UPPER GI ENDOSCOPY Bilateral 2013    Family History  Problem Relation Age of Onset   Diabetes Mother    Cancer Father        Stomach   Kidney disease Father    Hypertension Father     Social History   Tobacco Use   Smoking status: Former    Current packs/day: 0.00    Average packs/day: 0.8 packs/day for 22.0 years (16.5 ttl pk-yrs)    Types: Cigarettes    Start date: 85    Quit date: 2010  Years since quitting: 15.1   Smokeless tobacco: Never   Tobacco comments:    patient stated that he quit 2010  Vaping Use   Vaping status: Never Used  Substance Use Topics   Alcohol use: No    Alcohol/week: 0.0 standard drinks of alcohol   Drug use: No     Allergies  Allergen Reactions   Lisinopril     Dizziness, headache and drowsy     Health Maintenance  Topic Date Due   OPHTHALMOLOGY EXAM  Never done   COVID-19 Vaccine (3 - 2024-25 season) 01/28/2024 (Originally 07/20/2023)   Zoster Vaccines- Shingrix (1 of 2) 04/10/2024 (Originally 02/27/2020)   Pneumococcal Vaccine 32-56 Years old (1 of 2 - PCV) 01/11/2025 (Originally 02/27/1976)   HEMOGLOBIN A1C  03/12/2024   Diabetic kidney evaluation - eGFR measurement  09/11/2024    Diabetic kidney evaluation - Urine ACR  09/11/2024   FOOT EXAM  09/11/2024   Colonoscopy  12/01/2030   DTaP/Tdap/Td (3 - Td or Tdap) 06/07/2031   INFLUENZA VACCINE  Completed   Hepatitis C Screening  Completed   HIV Screening  Completed   HPV VACCINES  Aged Out    Chart Review Today: I personally reviewed active problem list, medication list, allergies, family history, social history, health maintenance, notes from last encounter, lab results, imaging with the patient/caregiver today.   Review of Systems  Constitutional: Negative.   HENT: Negative.    Eyes: Negative.   Respiratory: Negative.    Cardiovascular: Negative.   Gastrointestinal: Negative.   Endocrine: Negative.   Genitourinary: Negative.   Musculoskeletal: Negative.   Skin: Negative.   Allergic/Immunologic: Negative.   Neurological: Negative.   Hematological: Negative.   Psychiatric/Behavioral: Negative.    All other systems reviewed and are negative.    Objective:   Vitals:   01/13/24 0904  BP: 128/70  Pulse: 70  Resp: 16  SpO2: 98%  Weight: 185 lb (83.9 kg)  Height: 5\' 7"  (1.702 m)    Body mass index is 28.98 kg/m.  Physical Exam Vitals and nursing note reviewed.  Constitutional:      General: He is not in acute distress.    Appearance: Normal appearance. He is well-developed. He is not ill-appearing, toxic-appearing or diaphoretic.  HENT:     Head: Normocephalic and atraumatic.     Nose: Nose normal.  Eyes:     General:        Right eye: No discharge.        Left eye: No discharge.     Conjunctiva/sclera: Conjunctivae normal.  Neck:     Trachea: No tracheal deviation.  Cardiovascular:     Rate and Rhythm: Normal rate and regular rhythm.     Pulses: Normal pulses.     Heart sounds: Normal heart sounds.  Pulmonary:     Effort: Pulmonary effort is normal. No respiratory distress.     Breath sounds: No stridor.  Musculoskeletal:        General: Normal range of motion.  Skin:     General: Skin is warm and dry.     Findings: No rash.  Neurological:     Mental Status: He is alert.     Motor: No abnormal muscle tone.     Coordination: Coordination normal.  Psychiatric:        Behavior: Behavior normal.      Functional Status Survey:   Results for orders placed or performed in visit on 09/12/23  Lipid Profile   Collection Time:  09/12/23  9:52 AM  Result Value Ref Range   Cholesterol 239 (H) <200 mg/dL   HDL 49 > OR = 40 mg/dL   Triglycerides 161 <096 mg/dL   LDL Cholesterol (Calc) 166 (H) mg/dL (calc)   Total CHOL/HDL Ratio 4.9 <5.0 (calc)   Non-HDL Cholesterol (Calc) 190 (H) <130 mg/dL (calc)  COMPLETE METABOLIC PANEL WITH GFR   Collection Time: 09/12/23  9:52 AM  Result Value Ref Range   Glucose, Bld 92 65 - 99 mg/dL   BUN 21 7 - 25 mg/dL   Creat 0.45 4.09 - 8.11 mg/dL   eGFR 68 > OR = 60 BJ/YNW/2.95A2   BUN/Creatinine Ratio SEE NOTE: 6 - 22 (calc)   Sodium 142 135 - 146 mmol/L   Potassium 3.7 3.5 - 5.3 mmol/L   Chloride 103 98 - 110 mmol/L   CO2 32 20 - 32 mmol/L   Calcium 9.7 8.6 - 10.3 mg/dL   Total Protein 6.8 6.1 - 8.1 g/dL   Albumin 4.2 3.6 - 5.1 g/dL   Globulin 2.6 1.9 - 3.7 g/dL (calc)   AG Ratio 1.6 1.0 - 2.5 (calc)   Total Bilirubin 0.2 0.2 - 1.2 mg/dL   Alkaline phosphatase (APISO) 53 35 - 144 U/L   AST 23 10 - 35 U/L   ALT 23 9 - 46 U/L  CBC w/Diff/Platelet   Collection Time: 09/12/23  9:52 AM  Result Value Ref Range   WBC 4.1 3.8 - 10.8 Thousand/uL   RBC 4.75 4.20 - 5.80 Million/uL   Hemoglobin 12.8 (L) 13.2 - 17.1 g/dL   HCT 13.0 86.5 - 78.4 %   MCV 84.4 80.0 - 100.0 fL   MCH 26.9 (L) 27.0 - 33.0 pg   MCHC 31.9 (L) 32.0 - 36.0 g/dL   RDW 69.6 29.5 - 28.4 %   Platelets 211 140 - 400 Thousand/uL   MPV 11.5 7.5 - 12.5 fL   Neutro Abs 2,144 1,500 - 7,800 cells/uL   Absolute Lymphocytes 1,082 850 - 3,900 cells/uL   Absolute Monocytes 590 200 - 950 cells/uL   Eosinophils Absolute 242 15 - 500 cells/uL   Basophils Absolute 41  0 - 200 cells/uL   Neutrophils Relative % 52.3 %   Total Lymphocyte 26.4 %   Monocytes Relative 14.4 %   Eosinophils Relative 5.9 %   Basophils Relative 1.0 %  Microalbumin / creatinine urine ratio   Collection Time: 09/12/23  9:52 AM  Result Value Ref Range   Creatinine, Urine 163 20 - 320 mg/dL   Microalb, Ur 1.9 mg/dL   Microalb Creat Ratio 12 <30 mg/g creat  Hemoglobin A1c   Collection Time: 09/12/23  9:52 AM  Result Value Ref Range   Hgb A1c MFr Bld 6.4 (H) <5.7 % of total Hgb   Mean Plasma Glucose 137 mg/dL   eAG (mmol/L) 7.6 mmol/L      Assessment & Plan:   Type 2 diabetes mellitus without complication, without long-term current use of insulin (HCC) Assessment & Plan: Lab Results  Component Value Date   HGBA1C 6.4 (H) 09/12/2023   HGBA1C 6.7 (H) 03/13/2023   HGBA1C 5.8 (H) 06/12/2022   HGBA1C 6.1 (H) 12/12/2021  Improved with diet, he wishes to recheck A1C today DM foot exam and UACR & eGFR done and up to date Advised pt to get DM eye exam done   Orders: -     Hemoglobin A1c -     COMPLETE METABOLIC PANEL WITH GFR  Mixed  hyperlipidemia Assessment & Plan: Cholesterol elevated with last labs Recheck today and adjust statin if still elevated  Orders: -     COMPLETE METABOLIC PANEL WITH GFR -     Lipid panel  Primary hypertension Assessment & Plan: Managed on chlorthalidone 25 mg once daily, blood pressure at goal today BP Readings from Last 3 Encounters:  01/13/24 128/70  09/12/23 130/82  04/30/23 138/84  With new onset T2DM we should add ACEI or ARB for renal protection, pt has improved A1C with diet in a few months - so will continue to monitor if prediabetic vs T2DM and see if additional meds need to be added   Orders: -     COMPLETE METABOLIC PANEL WITH GFR  Encounter for medication monitoring -     Hemoglobin A1c -     COMPLETE METABOLIC PANEL WITH GFR -     Lipid panel  Gastroesophageal reflux disease, unspecified whether esophagitis  present Assessment & Plan: Well controlled with omeprazole   Benign prostatic hyperplasia with incomplete bladder emptying Assessment & Plan: Sx well controlled with tamsulosin 0.4 mg       Return in about 6 months (around 07/12/2024) for Annual Physical.   Danelle Berry, PA-C 01/13/24 9:38 AM

## 2024-01-13 ENCOUNTER — Ambulatory Visit (INDEPENDENT_AMBULATORY_CARE_PROVIDER_SITE_OTHER): Payer: Self-pay | Admitting: Family Medicine

## 2024-01-13 ENCOUNTER — Encounter: Payer: Self-pay | Admitting: Family Medicine

## 2024-01-13 VITALS — BP 128/70 | HR 70 | Resp 16 | Ht 67.0 in | Wt 185.0 lb

## 2024-01-13 DIAGNOSIS — K219 Gastro-esophageal reflux disease without esophagitis: Secondary | ICD-10-CM

## 2024-01-13 DIAGNOSIS — Z5181 Encounter for therapeutic drug level monitoring: Secondary | ICD-10-CM | POA: Diagnosis not present

## 2024-01-13 DIAGNOSIS — E782 Mixed hyperlipidemia: Secondary | ICD-10-CM | POA: Diagnosis not present

## 2024-01-13 DIAGNOSIS — E1169 Type 2 diabetes mellitus with other specified complication: Secondary | ICD-10-CM | POA: Diagnosis not present

## 2024-01-13 DIAGNOSIS — I10 Essential (primary) hypertension: Secondary | ICD-10-CM | POA: Diagnosis not present

## 2024-01-13 DIAGNOSIS — R3914 Feeling of incomplete bladder emptying: Secondary | ICD-10-CM

## 2024-01-13 DIAGNOSIS — E119 Type 2 diabetes mellitus without complications: Secondary | ICD-10-CM | POA: Diagnosis not present

## 2024-01-13 DIAGNOSIS — N401 Enlarged prostate with lower urinary tract symptoms: Secondary | ICD-10-CM

## 2024-01-13 NOTE — Assessment & Plan Note (Signed)
 Sx well controlled with tamsulosin 0.4 mg

## 2024-01-13 NOTE — Assessment & Plan Note (Signed)
 Lab Results  Component Value Date   HGBA1C 6.4 (H) 09/12/2023   HGBA1C 6.7 (H) 03/13/2023   HGBA1C 5.8 (H) 06/12/2022   HGBA1C 6.1 (H) 12/12/2021  Improved with diet, he wishes to recheck A1C today DM foot exam and UACR & eGFR done and up to date Advised pt to get DM eye exam done

## 2024-01-13 NOTE — Assessment & Plan Note (Signed)
 Cholesterol elevated with last labs Recheck today and adjust statin if still elevated

## 2024-01-13 NOTE — Assessment & Plan Note (Signed)
 Managed on chlorthalidone 25 mg once daily, blood pressure at goal today BP Readings from Last 3 Encounters:  01/13/24 128/70  09/12/23 130/82  04/30/23 138/84  With new onset T2DM we should add ACEI or ARB for renal protection, pt has improved A1C with diet in a few months - so will continue to monitor if prediabetic vs T2DM and see if additional meds need to be added

## 2024-01-13 NOTE — Assessment & Plan Note (Signed)
 Well controlled with omeprazole.

## 2024-01-14 LAB — COMPLETE METABOLIC PANEL WITH GFR
AG Ratio: 1.7 (calc) (ref 1.0–2.5)
ALT: 24 U/L (ref 9–46)
AST: 26 U/L (ref 10–35)
Albumin: 4.3 g/dL (ref 3.6–5.1)
Alkaline phosphatase (APISO): 67 U/L (ref 35–144)
BUN/Creatinine Ratio: 14 (calc) (ref 6–22)
BUN: 20 mg/dL (ref 7–25)
CO2: 32 mmol/L (ref 20–32)
Calcium: 10 mg/dL (ref 8.6–10.3)
Chloride: 99 mmol/L (ref 98–110)
Creat: 1.42 mg/dL — ABNORMAL HIGH (ref 0.70–1.30)
Globulin: 2.6 g/dL (ref 1.9–3.7)
Glucose, Bld: 100 mg/dL — ABNORMAL HIGH (ref 65–99)
Potassium: 3.5 mmol/L (ref 3.5–5.3)
Sodium: 140 mmol/L (ref 135–146)
Total Bilirubin: 0.3 mg/dL (ref 0.2–1.2)
Total Protein: 6.9 g/dL (ref 6.1–8.1)
eGFR: 59 mL/min/{1.73_m2} — ABNORMAL LOW (ref >=60)

## 2024-01-14 LAB — LIPID PANEL
Cholesterol: 138 mg/dL (ref <200)
HDL: 44 mg/dL (ref >=40)
LDL Cholesterol (Calc): 78 mg/dL
Non-HDL Cholesterol (Calc): 94 mg/dL (ref <130)
Total CHOL/HDL Ratio: 3.1 (calc) (ref <5.0)
Triglycerides: 76 mg/dL (ref <150)

## 2024-01-14 LAB — HEMOGLOBIN A1C
Hgb A1c MFr Bld: 6.4 %{Hb} — ABNORMAL HIGH (ref <5.7)
Mean Plasma Glucose: 137 mg/dL
eAG (mmol/L): 7.6 mmol/L

## 2024-01-15 ENCOUNTER — Encounter: Payer: Self-pay | Admitting: Family Medicine

## 2024-01-18 ENCOUNTER — Other Ambulatory Visit: Payer: Self-pay | Admitting: Nurse Practitioner

## 2024-01-18 DIAGNOSIS — N401 Enlarged prostate with lower urinary tract symptoms: Secondary | ICD-10-CM

## 2024-01-20 NOTE — Telephone Encounter (Signed)
 Requested Prescriptions  Pending Prescriptions Disp Refills   tamsulosin (FLOMAX) 0.4 MG CAPS capsule [Pharmacy Med Name: TAMSULOSIN HCL 0.4 MG CAPSULE] 90 capsule 0    Sig: TAKE 1 CAPSULE BY MOUTH EVERYDAY AT BEDTIME     Urology: Alpha-Adrenergic Blocker Failed - 01/20/2024  8:55 AM      Failed - PSA in normal range and within 360 days    PSA  Date Value Ref Range Status  06/12/2022 0.88 < OR = 4.00 ng/mL Final    Comment:    The total PSA value from this assay system is  standardized against the WHO standard. The test  result will be approximately 20% lower when compared  to the equimolar-standardized total PSA (Beckman  Coulter). Comparison of serial PSA results should be  interpreted with this fact in mind. . This test was performed using the Siemens  chemiluminescent method. Values obtained from  different assay methods cannot be used interchangeably. PSA levels, regardless of value, should not be interpreted as absolute evidence of the presence or absence of disease.          Passed - Last BP in normal range    BP Readings from Last 1 Encounters:  01/13/24 128/70         Passed - Valid encounter within last 12 months    Recent Outpatient Visits           4 months ago New onset type 2 diabetes mellitus (HCC)   [place] [facility] Mecum, Erin E, PA-C   8 months ago New onset type 2 diabetes mellitus [facility])   [place] [facility] Mecum, Oswaldo Conroy, PA-C   10 months ago Annual physical exam   [facility] Danelle Berry, PA-C   1 year ago Primary hypertension   [facility] Berniece Salines, FNP   2 years ago Primary hypertension   [facility] Berniece Salines, FNP       Future Appointments             In 1 month Danelle Berry, PA-C [facility], [facility]   In 5 months Danelle Berry, PA-C [facility], [facility]

## 2024-03-17 ENCOUNTER — Encounter: Payer: Self-pay | Admitting: Family Medicine

## 2024-04-29 ENCOUNTER — Other Ambulatory Visit: Payer: Self-pay | Admitting: Internal Medicine

## 2024-04-29 ENCOUNTER — Other Ambulatory Visit: Payer: Self-pay | Admitting: Nurse Practitioner

## 2024-04-29 DIAGNOSIS — N401 Enlarged prostate with lower urinary tract symptoms: Secondary | ICD-10-CM

## 2024-04-29 DIAGNOSIS — I10 Essential (primary) hypertension: Secondary | ICD-10-CM

## 2024-05-03 NOTE — Telephone Encounter (Signed)
 Requested medication (s) are due for refill today: yes  Requested medication (s) are on the active medication list: yes  Last refill:  01/20/24  Future visit scheduled: yes  Notes to clinic:  overdue lab   Requested Prescriptions  Pending Prescriptions Disp Refills   tamsulosin  (FLOMAX ) 0.4 MG CAPS capsule [Pharmacy Med Name: TAMSULOSIN  HCL 0.4 MG CAPSULE] 90 capsule 0    Sig: TAKE 1 CAPSULE BY MOUTH EVERYDAY AT BEDTIME     Urology: Alpha-Adrenergic Blocker Failed - 05/03/2024  8:54 AM      Failed - PSA in normal range and within 360 days    PSA  Date Value Ref Range Status  06/12/2022 0.88 < OR = 4.00 ng/mL Final    Comment:    The total PSA value from this assay system is  standardized against the WHO standard. The test  result will be approximately 20% lower when compared  to the equimolar-standardized total PSA (Beckman  Coulter). Comparison of serial PSA results should be  interpreted with this fact in mind. . This test was performed using the Siemens  chemiluminescent method. Values obtained from  different assay methods cannot be used interchangeably. PSA levels, regardless of value, should not be interpreted as absolute evidence of the presence or absence of disease.          Passed - Last BP in normal range    BP Readings from Last 1 Encounters:  01/13/24 128/70         Passed - Valid encounter within last 12 months    Recent Outpatient Visits           3 months ago Type 2 diabetes mellitus without complication, without long-term current use of insulin [facility])   [place] [facility] Adeline Hone, PA-C       Future Appointments             In 2 months Tapia, Leisa, PA-C [facility], [facility]

## 2024-05-03 NOTE — Telephone Encounter (Signed)
 Requested Prescriptions  Pending Prescriptions Disp Refills   chlorthalidone  (HYGROTON ) 25 MG tablet [Pharmacy Med Name: CHLORTHALIDONE  25 MG TABLET] 90 tablet 0    Sig: TAKE 1 TABLET (25 MG TOTAL) BY MOUTH DAILY.     Cardiovascular: Diuretics - Thiazide Failed - 05/03/2024  9:41 AM      Failed - Cr in normal range and within 180 days    Creat  Date Value Ref Range Status  01/13/2024 1.42 (H) 0.70 - 1.30 mg/dL Final   Creatinine, Urine  Date Value Ref Range Status  09/12/2023 163 20 - 320 mg/dL Final         Passed - K in normal range and within 180 days    Potassium  Date Value Ref Range Status  01/13/2024 3.5 3.5 - 5.3 mmol/L Final         Passed - Na in normal range and within 180 days    Sodium  Date Value Ref Range Status  01/13/2024 140 135 - 146 mmol/L Final         Passed - Last BP in normal range    BP Readings from Last 1 Encounters:  01/13/24 128/70         Passed - Valid encounter within last 6 months    Recent Outpatient Visits           3 months ago Type 2 diabetes mellitus without complication, without long-term current use of insulin [facility])   [place] [facility] Adeline Hone, PA-C       Future Appointments             In 2 months Tapia, Leisa, PA-C [place] [facility], [facility]

## 2024-07-14 ENCOUNTER — Encounter: Payer: No Typology Code available for payment source | Admitting: Family Medicine

## 2024-07-14 DIAGNOSIS — E119 Type 2 diabetes mellitus without complications: Secondary | ICD-10-CM

## 2024-07-14 DIAGNOSIS — Z0000 Encounter for general adult medical examination without abnormal findings: Secondary | ICD-10-CM

## 2024-07-28 ENCOUNTER — Encounter: Admitting: Family Medicine

## 2024-08-03 ENCOUNTER — Other Ambulatory Visit: Payer: Self-pay | Admitting: Family Medicine

## 2024-08-03 DIAGNOSIS — N401 Enlarged prostate with lower urinary tract symptoms: Secondary | ICD-10-CM

## 2024-08-03 DIAGNOSIS — I10 Essential (primary) hypertension: Secondary | ICD-10-CM

## 2024-08-05 NOTE — Telephone Encounter (Signed)
 Requested Prescriptions  Pending Prescriptions Disp Refills   chlorthalidone  (HYGROTON ) 25 MG tablet [Pharmacy Med Name: CHLORTHALIDONE  25 MG TABLET] 90 tablet 0    Sig: TAKE 1 TABLET (25 MG TOTAL) BY MOUTH DAILY.     Cardiovascular: Diuretics - Thiazide Failed - 08/05/2024 10:08 AM      Failed - Cr in normal range and within 180 days    Creat  Date Value Ref Range Status  01/13/2024 1.42 (H) 0.70 - 1.30 mg/dL Final   Creatinine, Urine  Date Value Ref Range Status  09/12/2023 163 20 - 320 mg/dL Final         Failed - K in normal range and within 180 days    Potassium  Date Value Ref Range Status  01/13/2024 3.5 3.5 - 5.3 mmol/L Final         Failed - Na in normal range and within 180 days    Sodium  Date Value Ref Range Status  01/13/2024 140 135 - 146 mmol/L Final         Failed - Valid encounter within last 6 months    Recent Outpatient Visits           6 months ago Type 2 diabetes mellitus without complication, without long-term current use of insulin [facility])   [place] [facility] [place], Michelene, PA-C              Passed - Last BP in normal range    BP Readings from Last 1 Encounters:  01/13/24 128/70          tamsulosin  (FLOMAX ) 0.4 MG CAPS capsule [Pharmacy Med Name: TAMSULOSIN  HCL 0.4 MG CAPSULE] 90 capsule 0    Sig: TAKE 1 CAPSULE BY MOUTH EVERYDAY AT BEDTIME     Urology: Alpha-Adrenergic Blocker Failed - 08/05/2024 10:08 AM      Failed - PSA in normal range and within 360 days    PSA  Date Value Ref Range Status  06/12/2022 0.88 < OR = 4.00 ng/mL Final    Comment:    The total PSA value from this assay system is  standardized against the WHO standard. The test  result will be approximately 20% lower when compared  to the equimolar-standardized total PSA (Beckman  Coulter). Comparison of serial PSA results should be  interpreted with this fact in mind. . This test was performed using the Siemens  chemiluminescent method. Values  obtained from  different assay methods cannot be used interchangeably. PSA levels, regardless of value, should not be interpreted as absolute evidence of the presence or absence of disease.          Passed - Last BP in normal range    BP Readings from Last 1 Encounters:  01/13/24 128/70         Passed - Valid encounter within last 12 months    Recent Outpatient Visits           6 months ago Type 2 diabetes mellitus without complication, without long-term current use of insulin [facility])   [facility] Michelene, PA-C

## 2024-08-05 NOTE — Telephone Encounter (Signed)
 Requested medications are due for refill today.  yes  Requested medications are on the active medications list. yes  Last refill. 05/03/2024 #90 0 rf  Future visit scheduled.   yes  Notes to clinic.  Labs are expired.    Requested Prescriptions  Pending Prescriptions Disp Refills   tamsulosin  (FLOMAX ) 0.4 MG CAPS capsule [Pharmacy Med Name: TAMSULOSIN  HCL 0.4 MG CAPSULE] 90 capsule 0    Sig: TAKE 1 CAPSULE BY MOUTH EVERYDAY AT BEDTIME     Urology: Alpha-Adrenergic Blocker Failed - 08/05/2024 10:08 AM      Failed - PSA in normal range and within 360 days    PSA  Date Value Ref Range Status  06/12/2022 0.88 < OR = 4.00 ng/mL Final    Comment:    The total PSA value from this assay system is  standardized against the WHO standard. The test  result will be approximately 20% lower when compared  to the equimolar-standardized total PSA (Beckman  Coulter). Comparison of serial PSA results should be  interpreted with this fact in mind. . This test was performed using the Siemens  chemiluminescent method. Values obtained from  different assay methods cannot be used interchangeably. PSA levels, regardless of value, should not be interpreted as absolute evidence of the presence or absence of disease.          Passed - Last BP in normal range    BP Readings from Last 1 Encounters:  01/13/24 128/70         Passed - Valid encounter within last 12 months    Recent Outpatient Visits           6 months ago Type 2 diabetes mellitus without complication, without long-term current use of insulin [facility])   [place] [facility] [place], Michelene, PA-C              Signed Prescriptions Disp Refills   chlorthalidone  (HYGROTON ) 25 MG tablet 90 tablet 0    Sig: TAKE 1 TABLET (25 MG TOTAL) BY MOUTH DAILY.     Cardiovascular: Diuretics - Thiazide Failed - 08/05/2024 10:08 AM      Failed - Cr in normal range and within 180 days    Creat  Date Value Ref Range Status   01/13/2024 1.42 (H) 0.70 - 1.30 mg/dL Final   Creatinine, Urine  Date Value Ref Range Status  09/12/2023 163 20 - 320 mg/dL Final         Failed - K in normal range and within 180 days    Potassium  Date Value Ref Range Status  01/13/2024 3.5 3.5 - 5.3 mmol/L Final         Failed - Na in normal range and within 180 days    Sodium  Date Value Ref Range Status  01/13/2024 140 135 - 146 mmol/L Final         Failed - Valid encounter within last 6 months    Recent Outpatient Visits           6 months ago Type 2 diabetes mellitus without complication, without long-term current use of insulin [facility])   [facility] [place], Michelene, PA-C              Passed - Last BP in normal range    BP Readings from Last 1 Encounters:  01/13/24 128/70

## 2024-08-11 ENCOUNTER — Encounter: Admitting: Family Medicine

## 2024-08-11 DIAGNOSIS — E782 Mixed hyperlipidemia: Secondary | ICD-10-CM

## 2024-08-11 DIAGNOSIS — Z0000 Encounter for general adult medical examination without abnormal findings: Secondary | ICD-10-CM

## 2024-08-11 DIAGNOSIS — E119 Type 2 diabetes mellitus without complications: Secondary | ICD-10-CM

## 2024-08-20 ENCOUNTER — Encounter: Admitting: Family Medicine

## 2024-08-27 ENCOUNTER — Encounter: Payer: Self-pay | Admitting: Nurse Practitioner

## 2024-08-27 ENCOUNTER — Ambulatory Visit (INDEPENDENT_AMBULATORY_CARE_PROVIDER_SITE_OTHER): Admitting: Nurse Practitioner

## 2024-08-27 VITALS — BP 116/82 | HR 73 | Resp 16 | Ht 67.0 in | Wt 189.0 lb

## 2024-08-27 DIAGNOSIS — N401 Enlarged prostate with lower urinary tract symptoms: Secondary | ICD-10-CM

## 2024-08-27 DIAGNOSIS — E1165 Type 2 diabetes mellitus with hyperglycemia: Secondary | ICD-10-CM

## 2024-08-27 DIAGNOSIS — Z0001 Encounter for general adult medical examination with abnormal findings: Secondary | ICD-10-CM

## 2024-08-27 DIAGNOSIS — Z0000 Encounter for general adult medical examination without abnormal findings: Secondary | ICD-10-CM

## 2024-08-27 DIAGNOSIS — R3914 Feeling of incomplete bladder emptying: Secondary | ICD-10-CM

## 2024-08-27 DIAGNOSIS — E782 Mixed hyperlipidemia: Secondary | ICD-10-CM

## 2024-08-27 DIAGNOSIS — I10 Essential (primary) hypertension: Secondary | ICD-10-CM

## 2024-08-27 DIAGNOSIS — Z125 Encounter for screening for malignant neoplasm of prostate: Secondary | ICD-10-CM

## 2024-08-27 DIAGNOSIS — Z23 Encounter for immunization: Secondary | ICD-10-CM

## 2024-08-27 NOTE — Progress Notes (Signed)
 Name: Bryan Green   MRN: [id number]    DOB: 1971/10/16   Date:08/27/2024       Progress Note  Subjective  Chief Complaint  Chief Complaint  Patient presents with   Annual Exam    HPI  Patient presents for annual CPE. Discussed the use of AI scribe software for clinical note transcription with the patient, who gave verbal consent to proceed.  History of Present Illness Bryan Green is a 53 year old male who presents for an annual physical exam.  Type 2 diabetes mellitus - Last hemoglobin A1c in February was 6.4%. - Overdue for microalbumin urine test. - No current symptoms of hyperglycemia or hypoglycemia.  Hypertension - Takes chlorthalidone  25 mg daily. - No current symptoms of headache, chest pain, or shortness of breath. - [facility] three [facility].  Hyperlipidemia - Takes atorvastatin  20 mg daily. - Last lipid panel showed normal levels. - [facility] two to three weeks ago.  Benign prostatic hyperplasia (bph) and lower urinary tract symptoms - Takes Flomax  0.4 mg daily. - No issues with urination. - [facility] two to three weeks ago.  Gastroesophageal reflux disease (gerd) - Takes omeprazole  40 mg daily. - No current symptoms of heartburn or dyspepsia.  Musculoskeletal pain - Occasional back pain, not considered serious.  Preventive care and lifestyle - Eats a well-balanced diet. - Exercises regularly, sometimes only once a week. - Typically gets about seven hours of sleep per night. - Overdue for dental and eye exams.    IPSS     Row Name 08/27/24 0810         International Prostate Symptom Score   How often have you had the sensation of not emptying your bladder? Not at All     How often have you had to urinate less than every two hours? Not at All     How often have you found you stopped and started again several times when you  urinated? Not at All     How often have you found it difficult to postpone urination? Not at All     How often have you had a weak urinary stream? Less than 1 in 5 times     How often have you had to strain to start urination? Not at All     How many times did you typically get up at night to urinate? None     Total IPSS Score 1         Diet: well balanced diet Exercise: regularly  Sleep: 7 hours Last dental exam:1 year ago, will schedule Last eye exam: will schedule  Depression: phq 9 is negative    08/27/2024    8:10 AM 09/12/2023    8:55 AM 03/13/2023    8:48 AM 06/12/2022    8:02 AM 12/12/2021    8:11 AM  Depression screen PHQ 2/9  Decreased Interest 0 0 0 0 0  Down, Depressed, Hopeless 0 0 0 0 0  PHQ - 2 Score 0 0 0 0 0  Altered sleeping  0 0  0  Tired, decreased energy  0 0  0  Change in appetite  0 0  0  Feeling bad or failure about yourself   0 0  0  Trouble concentrating  0 0  0  Moving slowly or fidgety/restless  0 0  0  Suicidal thoughts  0 0  0  PHQ-9 Score  0 0  0  Difficult doing work/chores  Not difficult at all   Not difficult at all    Hypertension:  BP Readings from Last 3 Encounters:  08/27/24 116/82  01/13/24 128/70  09/12/23 130/82    Obesity: Wt Readings from Last 3 Encounters:  08/27/24 189 lb (85.7 kg)  01/13/24 185 lb (83.9 kg)  09/12/23 185 lb 8 oz (84.1 kg)   BMI Readings from Last 3 Encounters:  08/27/24 29.60 kg/m  01/13/24 28.98 kg/m  09/12/23 29.05 kg/m     Lipids:  Lab Results  Component Value Date   CHOL 138 01/13/2024   CHOL 239 (H) 09/12/2023   CHOL 167 03/13/2023   Lab Results  Component Value Date   HDL 44 01/13/2024   HDL 49 09/12/2023   HDL 52 03/13/2023   Lab Results  Component Value Date   LDLCALC 78 01/13/2024   LDLCALC 166 (H) 09/12/2023   LDLCALC 99 03/13/2023   Lab Results  Component Value Date   TRIG 76 01/13/2024   TRIG 111 09/12/2023   TRIG 70 03/13/2023   Lab Results  Component Value  Date   CHOLHDL 3.1 01/13/2024   CHOLHDL 4.9 09/12/2023   CHOLHDL 3.2 03/13/2023   No results found for: LDLDIRECT Glucose:  Glucose, Bld  Date Value Ref Range Status  01/13/2024 100 (H) 65 - 99 mg/dL Final    Comment:    .            Fasting reference interval . For someone without known diabetes, a glucose value between 100 and 125 mg/dL is consistent with prediabetes and should be confirmed with a follow-up test. .   09/12/2023 92 65 - 99 mg/dL Final    Comment:    .            Fasting reference interval .   03/13/2023 103 (H) 65 - 99 mg/dL Final    Comment:    .            Fasting reference interval . For someone without known diabetes, a glucose value between 100 and 125 mg/dL is consistent with prediabetes and should be confirmed with a follow-up test. .     Flowsheet Row Office Visit from 08/27/2024 in [facility]  AUDIT-C Score 0     Divorced STD testing and prevention (HIV/chl/gon/syphilis): completed Hep C: completed  Skin cancer: Discussed monitoring for atypical lesions Colorectal cancer: 12/01/2020 Prostate cancer: due Lab Results  Component Value Date   PSA 0.88 06/12/2022   PSA 1.07 06/06/2021   PSA 0.9 05/12/2020     Lung cancer:   Low Dose CT Chest recommended if Age 75-80 years, 30 pack-year currently smoking OR have quit w/in 15years. Patient does not qualify.   AAA:  The USPSTF recommends one-time screening with ultrasonography in men ages 13 to 52 years who have ever smoked ECG:  08/02/2019  Vaccines:  HPV: up to at age 81 , ask insurance if age between 39-45  Shingrix: 68-64 yo and ask insurance if covered when patient above 82 yo Pneumonia:  educated and discussed with patient. Flu:  educated and discussed with patient.  Advanced Care Planning: A voluntary discussion about advance care planning including the explanation and discussion of advance directives.  Discussed health care proxy and Living  will, and the patient was able to identify a health care proxy as Mom, Bryan Green.  Patient does not have a living  will at present time. If patient does have living will, I have requested they bring this to the clinic to be scanned in to their chart.  Patient Active Problem List   Diagnosis Date Noted   New onset type 2 diabetes mellitus (HCC) 03/19/2023   Pure hypercholesterolemia 06/12/2022   Benign prostatic hyperplasia with incomplete bladder emptying 06/06/2021   Finger pain 06/06/2021   Hyperlipidemia 06/02/2018   Gastroesophageal reflux disease 04/27/2018   Primary hypertension 08/25/2017    Past Surgical History:  Procedure Laterality Date   COLONOSCOPY WITH PROPOFOL  N/A 12/01/2020   Procedure: COLONOSCOPY WITH PROPOFOL ;  Surgeon: Therisa Bi, MD;  Location: [facility] ENDOSCOPY;  Service: Gastroenterology;  Laterality: N/A;   ESOPHAGOGASTRODUODENOSCOPY (EGD) WITH PROPOFOL  N/A 12/01/2020   Procedure: ESOPHAGOGASTRODUODENOSCOPY (EGD) WITH PROPOFOL ;  Surgeon: Therisa Bi, MD;  Location: [facility] ENDOSCOPY;  Service: Gastroenterology;  Laterality: N/A;   UPPER GI ENDOSCOPY Bilateral 2013    Family History  Problem Relation Age of Onset   Diabetes Mother    Cancer Father        Stomach   Kidney disease Father    Hypertension Father     Social History   Socioeconomic History   Marital status: Divorced    Spouse name: Not on file   Number of children: 3   Years of education: Not on file   Highest education level: 12th grade  Occupational History   Occupation: truck driver   Tobacco Use   Smoking status: Former    Current packs/day: 0.00    Average packs/day: 0.8 packs/day for 22.0 years (16.5 ttl pk-yrs)    Types: Cigarettes    Start date: 49    Quit date: 2010    Years since quitting: 15.7   Smokeless tobacco: Never   Tobacco comments:    patient stated that he quit 2010  Vaping Use   Vaping status: Never Used  Substance and Sexual Activity   Alcohol use: No     Alcohol/week: 0.0 standard drinks of alcohol   Drug use: No   Sexual activity: Yes    Partners: Female    Birth control/protection: None  Other Topics Concern   Not on file  Social History Narrative   CDL but doing more loading/fork lifting    Significant other - fiance   Social Drivers of Corporate investment banker Strain: Low Risk  (08/27/2024)   Overall Financial Resource Strain (CARDIA)    Difficulty of Paying Living Expenses: Not hard at all  Food Insecurity: No Food Insecurity (08/27/2024)   Hunger Vital Sign    Worried About Running Out of Food in the Last Year: Never true    Ran Out of Food in the Last Year: Never true  Transportation Needs: No Transportation Needs (08/27/2024)   PRAPARE - Administrator, Civil Service (Medical): No    Lack of Transportation (Non-Medical): No  Physical Activity: Sufficiently Active (08/27/2024)   Exercise Vital Sign    Days of Exercise per Week: 3 days    Minutes of Exercise per Session: 60 min  Stress: No Stress Concern Present (08/27/2024)   [employer] of Occupational Health - Occupational Stress Questionnaire    Feeling of Stress: Not at all  Social Connections: Moderately Integrated (08/27/2024)   Social Connection and Isolation Panel    Frequency of Communication with Friends and Family: More than three times a week    Frequency of Social Gatherings with Friends and Family: More than three times a week  Attends Religious Services: More than 4 times per year    Active Member of Clubs or Organizations: Yes    Attends Banker Meetings: More than 4 times per year    Marital Status: Never married  Intimate Partner Violence: Not At Risk (08/27/2024)   Humiliation, Afraid, Rape, and Kick questionnaire    Fear of Current or Ex-Partner: No    Emotionally Abused: No    Physically Abused: No    Sexually Abused: No     Current Outpatient Medications:    atorvastatin  (LIPITOR) 20 MG tablet, TAKE 1  TABLET BY MOUTH EVERY DAY, Disp: 90 tablet, Rfl: 3   chlorthalidone  (HYGROTON ) 25 MG tablet, TAKE 1 TABLET (25 MG TOTAL) BY MOUTH DAILY., Disp: 90 tablet, Rfl: 0   omeprazole  (PRILOSEC) 40 MG capsule, TAKE 1 CAPSULE (40 MG TOTAL) BY MOUTH DAILY., Disp: 90 capsule, Rfl: 3   tamsulosin  (FLOMAX ) 0.4 MG CAPS capsule, TAKE 1 CAPSULE BY MOUTH EVERYDAY AT BEDTIME, Disp: 90 capsule, Rfl: 0  Allergies  Allergen Reactions   Lisinopril      Dizziness, headache and drowsy      ROS  Constitutional: Negative for fever or weight change.  Respiratory: Negative for cough and shortness of breath.   Cardiovascular: Negative for chest pain or palpitations.  Gastrointestinal: Negative for abdominal pain, no bowel changes.  Musculoskeletal: Negative for gait problem or joint swelling.  Skin: Negative for rash.  Neurological: Negative for dizziness or headache.  No other specific complaints in a complete review of systems (except as listed in HPI above).    Objective  Vitals:   08/27/24 0810  BP: 116/82  Pulse: 73  Resp: 16  SpO2: 99%  Weight: 189 lb (85.7 kg)  Height: 5' 7 (1.702 m)    Body mass index is 29.6 kg/m.  Physical Exam Vitals reviewed.  Constitutional:      Appearance: Normal appearance.  HENT:     Head: Normocephalic.     Right Ear: Tympanic membrane normal.     Left Ear: Tympanic membrane normal.     Nose: Nose normal.  Eyes:     Extraocular Movements: Extraocular movements intact.     Conjunctiva/sclera: Conjunctivae normal.     Pupils: Pupils are equal, round, and reactive to light.  Neck:     Thyroid: No thyroid mass, thyromegaly or thyroid tenderness.  Cardiovascular:     Rate and Rhythm: Normal rate and regular rhythm.     Pulses: Normal pulses.     Heart sounds: Normal heart sounds.  Pulmonary:     Effort: Pulmonary effort is normal.     Breath sounds: Normal breath sounds.  Abdominal:     General: Bowel sounds are normal.     Palpations: Abdomen is soft.   Musculoskeletal:        General: Normal range of motion.     Cervical back: Normal range of motion and neck supple.     Right lower leg: No edema.     Left lower leg: No edema.  Skin:    General: Skin is warm and dry.     Capillary Refill: Capillary refill takes less than 2 seconds.  Neurological:     General: No focal deficit present.     Mental Status: He is alert and oriented to person, place, and time. Mental status is at baseline.  Psychiatric:        Mood and Affect: Mood normal.        Behavior: Behavior normal.  Thought Content: Thought content normal.        Judgment: Judgment normal.      No results found for this or any previous visit (from the past 2160 hours).   Fall Risk:    08/27/2024    8:09 AM 09/12/2023    8:55 AM 04/30/2023    8:59 AM 03/13/2023    8:48 AM 06/12/2022    8:02 AM  Fall Risk   Falls in the past year? 0 0 0 0 0  Number falls in past yr: 0 0  0 0  Injury with Fall? 0 0  0 0  Risk for fall due to : No Fall Risks No Fall Risks No Fall Risks No Fall Risks   Follow up Falls prevention discussed Falls prevention discussed;Education provided;Falls evaluation completed Falls prevention discussed Falls prevention discussed Falls evaluation completed      Data saved with a previous flowsheet row definition      Functional Status Survey: Is the patient deaf or have difficulty hearing?: No Does the patient have difficulty seeing, even when wearing glasses/contacts?: No Does the patient have difficulty concentrating, remembering, or making decisions?: No Does the patient have difficulty walking or climbing stairs?: No Does the patient have difficulty dressing or bathing?: No Does the patient have difficulty doing errands alone such as visiting a doctor's office or shopping?: No    Assessment & Plan  Problem List Items Addressed This Visit       Cardiovascular and Mediastinum   Primary hypertension   Relevant Orders   CBC with  Differential/Platelet   Comprehensive metabolic panel with GFR     Endocrine   New onset type 2 diabetes mellitus (HCC)     Genitourinary   Benign prostatic hyperplasia with incomplete bladder emptying   Relevant Orders   PSA     Other   Hyperlipidemia   Relevant Orders   Lipid panel   Other Visit Diagnoses       Annual physical exam    -  Primary   Relevant Orders   CBC with Differential/Platelet   Comprehensive metabolic panel with GFR   Lipid panel   Hemoglobin A1c   PSA     Screening for prostate cancer       Relevant Orders   PSA     Immunization due       Relevant Orders   Pneumococcal conjugate vaccine 20-valent (Prevnar 20) (Completed)   Flu vaccine trivalent PF, 6mos and older(Flulaval,Afluria,Fluarix,Fluzone) (Completed)      Assessment and Plan Assessment & Plan Adult Wellness Visit Annual wellness visit conducted. Discussed diet, exercise, and sleep habits. Administered influenza and pneumococcal vaccines. - Encourage scheduling of dental and eye exams.  Hypertension Blood pressure is well-controlled at 116/82 mmHg with chlorthalidone  25 mg daily.  Type 2 diabetes mellitus Last HbA1c in February was 6.4%.  Benign prostatic hyperplasia with lower urinary tract symptoms BPH is managed with tamsulosin  0.4 mg daily. No current urinary symptoms reported.  Hyperlipidemia Hyperlipidemia is managed with atorvastatin  20 mg daily. Recent lipid panel was within normal range.  Gastroesophageal reflux disease GERD is managed with omeprazole  40 mg daily. No current symptoms reported.  Goals of Care Discussed advanced directives. He has designated his mother as a Education officer, environmental if he cannot make his own medical decisions. He is working on establishing a living will.     -Prostate cancer screening and PSA options (with potential risks and benefits of testing vs not testing) were discussed along with  recent recs/guidelines. -USPSTF grade A and B recommendations  reviewed with patient; age-appropriate recommendations, preventive care, screening tests, etc discussed and encouraged; healthy living encouraged; see AVS for patient education given to patient -Discussed importance of 150 minutes of physical activity weekly, eat two servings of fish weekly, eat one serving of tree nuts ( cashews, pistachios, pecans, almonds.SABRA) every other day, eat 6 servings of fruit/vegetables daily and drink plenty of water and avoid sweet beverages.  -Reviewed Health Maintenance: yes

## 2024-08-28 LAB — COMPREHENSIVE METABOLIC PANEL WITH GFR
AG Ratio: 1.9 (calc) (ref 1.0–2.5)
ALT: 36 U/L (ref 9–46)
AST: 35 U/L (ref 10–35)
Albumin: 4.5 g/dL (ref 3.6–5.1)
Alkaline phosphatase (APISO): 60 U/L (ref 35–144)
BUN: 18 mg/dL (ref 7–25)
CO2: 30 mmol/L (ref 20–32)
Calcium: 10 mg/dL (ref 8.6–10.3)
Chloride: 101 mmol/L (ref 98–110)
Creat: 1.29 mg/dL (ref 0.70–1.30)
Globulin: 2.4 g/dL (ref 1.9–3.7)
Glucose, Bld: 89 mg/dL (ref 65–99)
Potassium: 3.7 mmol/L (ref 3.5–5.3)
Sodium: 139 mmol/L (ref 135–146)
Total Bilirubin: 0.3 mg/dL (ref 0.2–1.2)
Total Protein: 6.9 g/dL (ref 6.1–8.1)
eGFR: 66 mL/min/1.73m2 (ref >=60)

## 2024-08-28 LAB — LIPID PANEL
Cholesterol: 152 mg/dL (ref <200)
HDL: 50 mg/dL (ref >=40)
LDL Cholesterol (Calc): 91 mg/dL
Non-HDL Cholesterol (Calc): 102 mg/dL (ref <130)
Total CHOL/HDL Ratio: 3 (calc) (ref <5.0)
Triglycerides: 38 mg/dL (ref <150)

## 2024-08-28 LAB — CBC WITH DIFFERENTIAL/PLATELET
Absolute Lymphocytes: 1400 {cells}/uL (ref 850–3900)
Absolute Monocytes: 635 {cells}/uL (ref 200–950)
Basophils Absolute: 70 {cells}/uL (ref 0–200)
Basophils Relative: 1.4 %
Eosinophils Absolute: 300 {cells}/uL (ref 15–500)
Eosinophils Relative: 6 %
HCT: 41.7 % (ref 38.5–50.0)
Hemoglobin: 13.5 g/dL (ref 13.2–17.1)
MCH: 27.7 pg (ref 27.0–33.0)
MCHC: 32.4 g/dL (ref 32.0–36.0)
MCV: 85.5 fL (ref 80.0–100.0)
MPV: 10.8 fL (ref 7.5–12.5)
Monocytes Relative: 12.7 %
Neutro Abs: 2595 {cells}/uL (ref 1500–7800)
Neutrophils Relative %: 51.9 %
Platelets: 254 Thousand/uL (ref 140–400)
RBC: 4.88 Million/uL (ref 4.20–5.80)
RDW: 12.8 % (ref 11.0–15.0)
Total Lymphocyte: 28 %
WBC: 5 Thousand/uL (ref 3.8–10.8)

## 2024-08-28 LAB — HEMOGLOBIN A1C
Hgb A1c MFr Bld: 6.5 % — ABNORMAL HIGH (ref <5.7)
Mean Plasma Glucose: 140 mg/dL
eAG (mmol/L): 7.7 mmol/L

## 2024-08-28 LAB — PSA: PSA: 1.76 ng/mL (ref <=4.00)

## 2024-08-28 LAB — MICROALBUMIN / CREATININE URINE RATIO
Creatinine, Urine: 40 mg/dL (ref 20–320)
Microalb Creat Ratio: 18 mg/g{creat} (ref <30)
Microalb, Ur: 0.7 mg/dL

## 2024-08-30 ENCOUNTER — Ambulatory Visit: Payer: Self-pay | Admitting: Nurse Practitioner

## 2024-09-07 ENCOUNTER — Other Ambulatory Visit: Payer: Self-pay | Admitting: Emergency Medicine

## 2024-11-19 ENCOUNTER — Other Ambulatory Visit: Payer: Self-pay | Admitting: Nurse Practitioner

## 2024-11-19 DIAGNOSIS — I10 Essential (primary) hypertension: Secondary | ICD-10-CM

## 2024-11-19 DIAGNOSIS — K219 Gastro-esophageal reflux disease without esophagitis: Secondary | ICD-10-CM

## 2024-11-19 DIAGNOSIS — N401 Enlarged prostate with lower urinary tract symptoms: Secondary | ICD-10-CM

## 2024-11-19 DIAGNOSIS — E7800 Pure hypercholesterolemia, unspecified: Secondary | ICD-10-CM

## 2024-11-19 NOTE — Telephone Encounter (Signed)
 Copied from CRM [id number]. Topic: Clinical - Medication Refill >> Nov 19, 2024  2:48 PM Wess RAMAN wrote: Medication: atorvastatin  (LIPITOR) 20 MG tablet  chlorthalidone  (HYGROTON ) 25 MG tablet  omeprazole  (PRILOSEC) 40 MG capsule  tamsulosin  (FLOMAX ) 0.4 MG CAPS capsule  Has the patient contacted their pharmacy? Yes (Agent: If no, request that the patient contact the pharmacy for the refill. If patient does not wish to contact the pharmacy document the reason why and proceed with request.) (Agent: If yes, when and what did the pharmacy advise?)  This is the patient's preferred pharmacy:  [id number] [place], [place] - [address] [place] [address] Phone: [phone] Fax: [phone]  Is this the correct pharmacy for this prescription? Yes If no, delete pharmacy and type the correct one.   Has the prescription been filled recently? Yes  Is the patient out of the medication? Yes  Has the patient been seen for an appointment in the last year OR does the patient have an upcoming appointment? Yes  Can we respond through MyChart? Yes  Agent: Please be advised that Rx refills may take up to 3 business days. We ask that you follow-up with your pharmacy.

## 2024-11-22 MED ORDER — TAMSULOSIN HCL 0.4 MG PO CAPS: ORAL_CAPSULE | ORAL | 0 refills | Status: AC

## 2024-11-22 MED ORDER — ATORVASTATIN CALCIUM 20 MG PO TABS: 20.0000 mg | ORAL_TABLET | Freq: Every day | ORAL | 0 refills | Status: AC

## 2024-11-22 MED ORDER — CHLORTHALIDONE 25 MG PO TABS: 25.0000 mg | ORAL_TABLET | Freq: Every day | ORAL | 0 refills | Status: AC

## 2024-11-22 MED ORDER — OMEPRAZOLE 40 MG PO CPDR: 40.0000 mg | DELAYED_RELEASE_CAPSULE | Freq: Every day | ORAL | 0 refills | Status: AC

## 2024-11-22 NOTE — Telephone Encounter (Signed)
 Requested medication (s) are due for refill today: expired medication dates- lipitor, prilosec, no refills hygroton , flomax   Requested medication (s) are on the active medication list: yes   Last refill:  lipitor, prilosec - 09/29/23 #90 3 refills, hygroton , flomax - 08/05/24 #90 0 refills  Future visit scheduled: yes 08/30/25  Notes to clinic:  expired medication dates, medications last ordered by L. Leavy, PA. Need new Rx request per pharmacy. Do you want to reorder Rxs?     Requested Prescriptions  Pending Prescriptions Disp Refills   atorvastatin  (LIPITOR) 20 MG tablet 90 tablet 3    Sig: Take 1 tablet (20 mg total) by mouth daily.     Cardiovascular:  Antilipid - Statins Failed - 11/22/2024 10:09 AM      Failed - Lipid Panel in normal range within the last 12 months    Cholesterol  Date Value Ref Range Status  08/27/2024 152 <200 mg/dL Final   LDL Cholesterol (Calc)  Date Value Ref Range Status  08/27/2024 91 mg/dL (calc) Final    Comment:    Reference range: <100 . Desirable range <100 mg/dL for primary prevention;   <70 mg/dL for patients with CHD or diabetic patients  with > or = 2 CHD risk factors. SABRA LDL-C is now calculated using the Martin-Hopkins  calculation, which is a validated novel method providing  better accuracy than the Friedewald equation in the  estimation of LDL-C.  Gladis APPLETHWAITE et al. SANDREA. 7986;689(80): 2061-2068  (http://education.[facility].com/faq/FAQ164)    HDL  Date Value Ref Range Status  08/27/2024 50 > OR = 40 mg/dL Final   Triglycerides  Date Value Ref Range Status  08/27/2024 38 <150 mg/dL Final         Passed - Patient is not pregnant      Passed - Valid encounter within last 12 months    Recent Outpatient Visits           2 months ago Annual physical exam   [facility] Gareth Mliss FALCON, FNP   10 months ago Type 2 diabetes mellitus without complication, without long-term current use of insulin  [facility])   [place] [facility] [place], Leisa, PA-C               chlorthalidone  (HYGROTON ) 25 MG tablet 90 tablet 0    Sig: Take 1 tablet (25 mg total) by mouth daily.     Cardiovascular: Diuretics - Thiazide Passed - 11/22/2024 10:09 AM      Passed - Cr in normal range and within 180 days    Creat  Date Value Ref Range Status  08/27/2024 1.29 0.70 - 1.30 mg/dL Final   Creatinine, Urine  Date Value Ref Range Status  08/27/2024 40 20 - 320 mg/dL Final         Passed - K in normal range and within 180 days    Potassium  Date Value Ref Range Status  08/27/2024 3.7 3.5 - 5.3 mmol/L Final         Passed - Na in normal range and within 180 days    Sodium  Date Value Ref Range Status  08/27/2024 139 135 - 146 mmol/L Final         Passed - Last BP in normal range    BP Readings from Last 1 Encounters:  08/27/24 116/82         Passed - Valid encounter within last 6 months    Recent Outpatient Visits  2 months ago Annual physical exam   [facility] Gareth Clarity F, FNP   10 months ago Type 2 diabetes mellitus without complication, without long-term current use of insulin [facility])   [place] [facility] [place], Leisa, PA-C               omeprazole  (PRILOSEC) 40 MG capsule 90 capsule 3    Sig: Take 1 capsule (40 mg total) by mouth daily.     Gastroenterology: Proton Pump Inhibitors Passed - 11/22/2024 10:09 AM      Passed - Valid encounter within last 12 months    Recent Outpatient Visits           2 months ago Annual physical exam   [facility] Gareth Clarity FALCON, FNP   10 months ago Type 2 diabetes mellitus without complication, without long-term current use of insulin [facility])   [place] [facility] [place], Leisa, PA-C               tamsulosin  (FLOMAX ) 0.4 MG CAPS capsule 90 capsule 0    Sig: TAKE 1 CAPSULE BY MOUTH EVERYDAY AT BEDTIME     Urology:  Alpha-Adrenergic Blocker Passed - 11/22/2024 10:09 AM      Passed - PSA in normal range and within 360 days    PSA  Date Value Ref Range Status  08/27/2024 1.76 < OR = 4.00 ng/mL Final    Comment:    The total PSA value from this assay system is  standardized against the WHO standard. The test  result will be approximately 20% lower when compared  to the equimolar-standardized total PSA (Beckman  Coulter). Comparison of serial PSA results should be  interpreted with this fact in mind. . This test was performed using the Siemens  chemiluminescent method. Values obtained from  different assay methods cannot be used interchangeably. PSA levels, regardless of value, should not be interpreted as absolute evidence of the presence or absence of disease.          Passed - Last BP in normal range    BP Readings from Last 1 Encounters:  08/27/24 116/82         Passed - Valid encounter within last 12 months    Recent Outpatient Visits           2 months ago Annual physical exam   [facility] Gareth Clarity F, FNP   10 months ago Type 2 diabetes mellitus without complication, without long-term current use of insulin [facility])   [facility] [place] Leavy Mole, PA-C

## 2025-08-30 ENCOUNTER — Encounter: Admitting: Nurse Practitioner
